# Patient Record
Sex: Male | Born: 2000 | Race: Black or African American | Hispanic: No | Marital: Single | Smoking: Never smoker
Health system: Southern US, Community
[De-identification: ages and names within clinical notes are randomized; demographics above are authoritative.]

## PROBLEM LIST (undated history)

## (undated) ENCOUNTER — Emergency Department (HOSPITAL_BASED_OUTPATIENT_CLINIC_OR_DEPARTMENT_OTHER): Payer: Medicaid Other

## (undated) DIAGNOSIS — J45909 Unspecified asthma, uncomplicated: Secondary | ICD-10-CM

## (undated) DIAGNOSIS — E119 Type 2 diabetes mellitus without complications: Secondary | ICD-10-CM

## (undated) HISTORY — PX: FRACTURE SURGERY: SHX138

## (undated) HISTORY — DX: Type 2 diabetes mellitus without complications: E11.9

---
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
## 2020-01-01 ENCOUNTER — Encounter (INDEPENDENT_AMBULATORY_CARE_PROVIDER_SITE_OTHER): Payer: Self-pay | Admitting: Family

## 2020-01-01 ENCOUNTER — Ambulatory Visit (INDEPENDENT_AMBULATORY_CARE_PROVIDER_SITE_OTHER): Payer: Medicaid Other | Admitting: Family

## 2020-01-01 ENCOUNTER — Other Ambulatory Visit (INDEPENDENT_AMBULATORY_CARE_PROVIDER_SITE_OTHER): Payer: Self-pay

## 2020-01-01 ENCOUNTER — Other Ambulatory Visit: Payer: Self-pay

## 2020-01-01 VITALS — BP 118/72 | HR 88 | Ht 70.3 in | Wt 206.6 lb

## 2020-01-01 DIAGNOSIS — E109 Type 1 diabetes mellitus without complications: Secondary | ICD-10-CM | POA: Diagnosis not present

## 2020-01-01 DIAGNOSIS — E11649 Type 2 diabetes mellitus with hypoglycemia without coma: Secondary | ICD-10-CM

## 2020-01-01 DIAGNOSIS — Z794 Long term (current) use of insulin: Secondary | ICD-10-CM

## 2020-01-01 DIAGNOSIS — R739 Hyperglycemia, unspecified: Secondary | ICD-10-CM | POA: Diagnosis not present

## 2020-01-01 DIAGNOSIS — IMO0002 Reserved for concepts with insufficient information to code with codable children: Secondary | ICD-10-CM

## 2020-01-01 DIAGNOSIS — E1065 Type 1 diabetes mellitus with hyperglycemia: Secondary | ICD-10-CM

## 2020-01-01 DIAGNOSIS — F54 Psychological and behavioral factors associated with disorders or diseases classified elsewhere: Secondary | ICD-10-CM

## 2020-01-01 LAB — POCT GLYCOSYLATED HEMOGLOBIN (HGB A1C): Hemoglobin A1C: 6.8 % — AB (ref 4.0–5.6)

## 2020-01-01 LAB — POCT GLUCOSE (DEVICE FOR HOME USE): POC Glucose: 227 mg/dl — AB (ref 70–99)

## 2020-01-01 MED ORDER — GLUCAGON (RDNA) 1 MG IJ KIT: PACK | INTRAMUSCULAR | 3 refills | Status: AC

## 2020-01-01 NOTE — Patient Instructions (Signed)
-  Always have fast sugar with you in case of low blood sugar (glucose tabs, regular juice or soda, candy) -Always wear your ID that states you have diabetes -Always bring your meter to your visit -Call/Email if you want to review blood sugars   - Decrease to 18 units of lantus  - A1c is 6.8%!!

## 2020-01-01 NOTE — Progress Notes (Signed)
Pediatric Endocrinology Diabetes Consultation Follow-up Visit  Jacob Martin 2001-07-06 [id number]  Chief Complaint: Follow-up type 1 diabetes   Starkes-Perry, [name], FNP   HPI: Jacob Martin  is a 19 y.o. male presenting for follow-up of type 1 diabetes. he is accompanied to this visit by his father.  1. 1. Jacob Martin was diagnosed with type 1 diabetes on 01/08/14. He had a 2 week history of polyuria/polydipsia with new onset enuresis. The night prior to diagnosis he was staying with his grandmother who is a type 2 diabetic. She became concerned and checked a sugar which was 456 mg/dL. She then took him to the [facility] where his sugar was 409. He was not in DKA. He was admitted to the peds ward for evaluation and management and was started on MDI with Lantus and Novolog.   2. Since last visit to [facility] on 08/2019, he has been well.  No ER visits or hospitalizations.   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 He is also working for [employer]. He reports that he feels like things are going pretty well with diabetes care. He is using Dexcom CGM, it is helpful. Reports pattern of hyperglycemia in the morning but recently increased his Basaglar to 20 units which has helped. He reports occasional hypoglycemia and usually feels tired and hungry when low. He is not interested in insulin pump therapy at this time.    Insulin regimen: 20 units of Basaglar, Novolog 120/30/6 Hypoglycemia Able to feel low blood sugars.  No glucagon needed recently.  Blood glucose download:    - Dexcom CGM download    Med-alert ID: Not currently wearing. Injection sites: Abdomen and arms  Annual labs due: Ordered.  Ophthalmology due: 2018. Advised that he is overdue and importance of eye exam.      3. ROS: Greater than 10 systems reviewed with pertinent positives listed in HPI, otherwise neg. Constitutional: Sleeping well. Weight stable.  Eyes: No changes in vision. Denies  blurry vision. Due for eye exam and discussed today.  Ears/Nose/Mouth/Throat: No difficulty swallowing. Denies neck pain  Cardiovascular: No palpitations. Denies tachycardia and chest pain  Respiratory: No increased work of breathing. Denies SOB  Gastrointestinal: No constipation or diarrhea. No abdominal pain Neurologic: Normal sensation, no tremor Endocrine: No polydipsia.  No hyperpigmentation Psychiatric: Normal affect. Denies anxiety and depression.   Past Medical History:   Past Medical History:  Diagnosis Date  . Asthma   . Diabetes mellitus without complication ([place])     Medications:  Outpatient Encounter Medications as of 01/01/2020  Medication Sig  . BD PEN NEEDLE NANO U/F 32G X 4 MM MISC FOR INJECTIONS 6 TIMES DAILY  . clotrimazole (MYCELEX) 10 MG troche DISSOLVE 1 LOZENGES FIVE TIMES DAILY AS DIRECTED BY MD  . Continuous Blood Gluc Receiver (DEXCOM G6 RECEIVER) DEVI 1 Device by Does not apply route daily as needed.  . Continuous Blood Gluc Sensor (DEXCOM G6 SENSOR) MISC 3 kits by Does not apply route daily as needed (Change sensor every 10 days).  . Continuous Blood Gluc Transmit (DEXCOM G6 TRANSMITTER) MISC 1 kit by Does not apply route daily as needed.  Marland Kitchen glucagon 1 MG injection Use for Severe Hypoglycemia . Inject 1 mg intramuscularly if unresponsive, unable to swallow, unconscious and/or has seizure  . glucose blood (ACCU-CHEK GUIDE) test strip Check BG 6x day  . ibuprofen (ADVIL,MOTRIN) 600 MG tablet Take 1 tab PO Q6H x 1-2 days then Q6h  prn  . insulin aspart (NOVOLOG FLEXPEN) 100 UNIT/ML FlexPen ADMINISTER UP TO 100 UNITS UNDER THE SKIN DAILY  . Insulin Glargine (LANTUS SOLOSTAR) 100 UNIT/ML Solostar Pen Use up to 50 units daily   No facility-administered encounter medications on file as of 01/01/2020.    Allergies: Allergies  Allergen Reactions  . Shrimp [Shellfish Allergy] Nausea And Vomiting    Can eat crab legs without any issues    Surgical History: Past  Surgical History:  Procedure Laterality Date  . FRACTURE SURGERY     right forearm hairline fx/ cast, but no pins/rods    Family History:  Family History  Problem Relation Age of Onset  . Hypertension Father   . Diabetes Paternal Grandmother   . Hypertension Paternal Grandmother   . Thyroid disease Paternal Grandmother   . Hypertension Paternal Grandfather       Social History: Lives with: Father   Physical Exam:  Vitals:   01/01/20 1506  BP: 118/72  Pulse: 88  Weight: 206 lb 9.6 oz (93.7 kg)  Height: 5' 10.3" (1.786 m)   BP 118/72   Pulse 88   Ht 5' 10.3" (1.786 m)   Wt 206 lb 9.6 oz (93.7 kg)   BMI 29.39 kg/m  Body mass index: body mass index is 29.39 kg/m. Blood pressure percentiles are not available for patients who are 18 years or older.  Ht Readings from Last 3 Encounters:  01/01/20 5' 10.3" (1.786 m) (61 %, Z= 0.29)*  08/29/19 5' 10.24" (1.784 m) (61 %, Z= 0.29)*  05/29/19 5' 10.87" (1.8 m) (70 %, Z= 0.53)*   * Growth percentiles are based on CDC (Boys, 2-20 Years) data.   Wt Readings from Last 3 Encounters:  01/01/20 206 lb 9.6 oz (93.7 kg) (95 %, Z= 1.66)*  08/29/19 209 lb 9.6 oz (95.1 kg) (96 %, Z= 1.75)*  05/29/19 203 lb (92.1 kg) (95 %, Z= 1.64)*   * Growth percentiles are based on CDC (Boys, 2-20 Years) data.    PHYSICAL EXAM:  General: Well developed, well nourished male in no acute distress.  Alert and oriented.  Head: Normocephalic, atraumatic.   Eyes:  Pupils equal and round. EOMI.  Sclera white.  No eye drainage.   Ears/Nose/Mouth/Throat: Nares patent, no nasal drainage.  Normal dentition, mucous membranes moist.  Neck: supple, no cervical lymphadenopathy, no thyromegaly Cardiovascular: regular rate, normal S1/S2, no murmurs Respiratory: No increased work of breathing.  Lungs clear to auscultation bilaterally.  No wheezes. Abdomen: soft, nontender, nondistended. Normal bowel sounds.  No appreciable masses  Extremities: warm, well  perfused, cap refill < 2 sec.   Musculoskeletal: Normal muscle mass.  Normal strength Skin: warm, dry.  No rash or lesions. Neurologic: alert and oriented, normal speech, no tremor   Labs: Last hemoglobin A1c: 7.5% on 08/2019 Results for orders placed or performed in visit on 01/01/20  POCT glycosylated hemoglobin (Hb A1C)  Result Value Ref Range   Hemoglobin A1C 6.8 (A) 4.0 - 5.6 %   HbA1c POC (<> result, manual entry)     HbA1c, POC (prediabetic range)     HbA1c, POC (controlled diabetic range)    POCT Glucose (Device for Home Use)  Result Value Ref Range   Glucose Fasting, POC     POC Glucose 227 (A) 70 - 99 mg/dl      Assessment/Plan: Clemens is a 19 y.o. male with uncontrolled type 1 diabetes on MDI. Jacob Martin continues to do an excellent job managing his diabetes. He  is having a pattern of hypoglycemia in the early morning after increasing his Basaglar dose, will decrease today. His hemoglobin A1c is 6.8% which meets the ADA goal of <7%   1-3. DM w/o complication type I, uncontrolled (HCC)/ Hyperglycemia/hypoglycemia/Insulin dose change.  - 18 units of Basaglar  - Novolog 120/30/6 plan   - Add 1 unit to breakfast.  - Reviewed meterp and CGM download. Discussed trends and patterns.  - Rotate injectionsites to prevent scar tissue.  - bolus 15 minutes prior to eating to limit blood sugar spikes.  - Reviewed carb counting and importance of accurate carb counting.  - Discussed signs and symptoms of hypoglycemia. Always have glucose available.  - POCT glucose and hemoglobin A1c  - Reviewed growth chart.  - Lipid panel, TFTS and microalbumin ordered.   4.  Maladaptive Behaviors  - Praise given for improvements !  - Discussed managing diabetes during college and changes he has experienced.  - Answered questions.   Follow-up:   3 months.   >45 spent today reviewing the medical chart, counseling the patient/family, and documenting today's visit.   When a patient is on insulin,  intensive monitoring of blood glucose levels is necessary to avoid hyperglycemia and hypoglycemia. Severe hyperglycemia/hypoglycemia can lead to hospital admissions and be life threatening.    Hermenia Bers,  FNP-C  Pediatric Specialist  [address] [place]  [place], [address]  Tele: [phone]

## 2020-01-09 ENCOUNTER — Telehealth (INDEPENDENT_AMBULATORY_CARE_PROVIDER_SITE_OTHER): Payer: Self-pay | Admitting: Family

## 2020-01-09 ENCOUNTER — Other Ambulatory Visit (INDEPENDENT_AMBULATORY_CARE_PROVIDER_SITE_OTHER): Payer: Self-pay | Admitting: Family

## 2020-01-09 NOTE — Telephone Encounter (Signed)
Excript was received from pharmacy. Sent throughy there.

## 2020-01-09 NOTE — Telephone Encounter (Signed)
Who's calling (name and relationship to patient) : Sahir Tolson dad  Best contact number: [phone]  Provider they see: Ovidio Kin   Reason for call: Dad called to request more NANO needles be ordered.  Call ID:      PRESCRIPTION REFILL ONLY  Name of prescription: nano needles  Pharmacy: [facility] [address]

## 2020-03-04 ENCOUNTER — Encounter (INDEPENDENT_AMBULATORY_CARE_PROVIDER_SITE_OTHER): Payer: Self-pay

## 2020-03-04 ENCOUNTER — Telehealth (INDEPENDENT_AMBULATORY_CARE_PROVIDER_SITE_OTHER): Payer: Self-pay | Admitting: Family

## 2020-03-04 NOTE — Telephone Encounter (Signed)
Has type 1 diabetes. Due to his diabetes he needs access to snacks/food/water/drinks at all times including when at desk. He should also be allowed to use bathroom as needed.

## 2020-03-04 NOTE — Telephone Encounter (Signed)
Called patient to let him know letter would be ready for pick ip in the morning. No vm option to leave a message

## 2020-03-04 NOTE — Telephone Encounter (Signed)
Writer reached back out to patient to let him know letter was ready for pick up, states he will be by tomorrow to pick up

## 2020-03-04 NOTE — Telephone Encounter (Signed)
Who's calling (name and relationship to patient) : Jacob Martin (self)  Best contact number: [phone]  Provider they see: Ovidio Kin  Reason for call:   Jacob Martin came into the office today requesting a modified work note stating that he needs to have access to snacks at his desk at work and the ability to get up to use the restroom because of his T1D. Please call [name] at [phone] when letter is completed and he will pick that up. Requesting if possible letter be completed by Friday  Call ID:      PRESCRIPTION REFILL ONLY  Name of prescription:  Pharmacy:

## 2020-03-04 NOTE — Telephone Encounter (Signed)
What verbiage would I use for this?

## 2020-03-04 NOTE — Telephone Encounter (Signed)
Letter is typed and ready for pick up

## 2020-04-01 ENCOUNTER — Other Ambulatory Visit (INDEPENDENT_AMBULATORY_CARE_PROVIDER_SITE_OTHER): Payer: Self-pay | Admitting: "Endocrinology

## 2020-04-01 ENCOUNTER — Telehealth (INDEPENDENT_AMBULATORY_CARE_PROVIDER_SITE_OTHER): Payer: Self-pay | Admitting: Family

## 2020-04-01 ENCOUNTER — Ambulatory Visit (INDEPENDENT_AMBULATORY_CARE_PROVIDER_SITE_OTHER): Payer: Medicaid Other | Admitting: Family

## 2020-04-01 ENCOUNTER — Other Ambulatory Visit: Payer: Self-pay

## 2020-04-01 ENCOUNTER — Encounter (INDEPENDENT_AMBULATORY_CARE_PROVIDER_SITE_OTHER): Payer: Self-pay | Admitting: Family

## 2020-04-01 VITALS — BP 118/68 | HR 88 | Wt 192.2 lb

## 2020-04-01 DIAGNOSIS — E109 Type 1 diabetes mellitus without complications: Secondary | ICD-10-CM | POA: Diagnosis not present

## 2020-04-01 DIAGNOSIS — R739 Hyperglycemia, unspecified: Secondary | ICD-10-CM

## 2020-04-01 DIAGNOSIS — F54 Psychological and behavioral factors associated with disorders or diseases classified elsewhere: Secondary | ICD-10-CM | POA: Diagnosis not present

## 2020-04-01 DIAGNOSIS — E11649 Type 2 diabetes mellitus with hypoglycemia without coma: Secondary | ICD-10-CM

## 2020-04-01 DIAGNOSIS — Z794 Long term (current) use of insulin: Secondary | ICD-10-CM

## 2020-04-01 LAB — LIPID PANEL
Cholesterol: 139 mg/dL (ref <170)
HDL: 43 mg/dL — ABNORMAL LOW (ref >45)
LDL Cholesterol (Calc): 83 mg/dL (calc) (ref <110)
Non-HDL Cholesterol (Calc): 96 mg/dL (calc) (ref <120)
Total CHOL/HDL Ratio: 3.2 (calc) (ref <5.0)
Triglycerides: 51 mg/dL (ref <90)

## 2020-04-01 LAB — POCT GLYCOSYLATED HEMOGLOBIN (HGB A1C): Hemoglobin A1C: 6.7 % — AB (ref 4.0–5.6)

## 2020-04-01 LAB — TSH: TSH: 1.45 mIU/L (ref 0.50–4.30)

## 2020-04-01 LAB — POCT GLUCOSE (DEVICE FOR HOME USE): POC Glucose: 176 mg/dl — AB (ref 70–99)

## 2020-04-01 LAB — T4, FREE: Free T4: 1.2 ng/dL (ref 0.8–1.4)

## 2020-04-01 MED ORDER — DEXCOM G6 SENSOR MISC: 3.0000 | Freq: Every day | 5 refills | Status: DC | PRN

## 2020-04-01 MED ORDER — DEXCOM G6 TRANSMITTER MISC: 1 refills | Status: DC

## 2020-04-01 NOTE — Telephone Encounter (Signed)
Who's calling (name and relationship to patient) : Jacob Martin (self)  Best contact number: [phone]  Provider they see: Gretchen Short  Reason for call: States that pharmacy has not received refill from our office on his Dexcom sensors. He is on his last one. He requests we call pharmacy to make sure he gets his prescription.   Call ID:      PRESCRIPTION REFILL ONLY  Name of prescription: Dexcom  [facility]: [facility] [address] In [place]

## 2020-04-01 NOTE — Patient Instructions (Signed)
-   At night, Use 175 as target blood sugar. Give 1 unit for every 40 point above 175.   - Add 1 unit to snack/dinner when working   - Clinical biochemist

## 2020-04-01 NOTE — Telephone Encounter (Signed)
Supplies have been sent to pharmacy

## 2020-04-01 NOTE — Progress Notes (Signed)
Pediatric Endocrinology Diabetes Consultation Follow-up Visit  Jacob Martin 12/31/2000 [id number]  Chief Complaint: Follow-up type 1 diabetes   Starkes-Perry, [name], FNP   HPI: Jacob Martin  is a 19 y.o. male presenting for follow-up of type 1 diabetes. he is accompanied to this visit by his father.  1. 1. Kalif was diagnosed with type 1 diabetes on 01/08/14. He had a 2 week history of polyuria/polydipsia with new onset enuresis. The night prior to diagnosis he was staying with his grandmother who is a type 2 diabetic. She became concerned and checked a sugar which was 456 mg/dL. She then took him to the [facility] where his sugar was 409. He was not in DKA. He was admitted to the peds ward for evaluation and management and was started on MDI with Lantus and Novolog.   2. Since last visit to [facility] on 12/2019, he has been well.  No ER visits or hospitalizations.   He is working full time at [name] and in school at [name]. He is wearing Dexcom CGM, it is working well for him, he wears it on his stomach. Blood sugars have been "ok", a little higher overnight. He denies missed injections but feels like his schedule makes things harder to manage. He has also been stressed because his father may need cardiac surgery.   He reports that he has lost weight due to eating less and also feels he has lost muscle mass from inactivity now that he is working. Denies diarrhea, abdominal pain, nausea and vomiting.   Insulin regimen: 18 units of Basaglar, Novolog 120/30/6 Hypoglycemia Able to feel low blood sugars.  No glucagon needed recently.  Blood glucose download:    - Dexcom CGM download    Med-alert ID: Not currently wearing. Injection sites: Abdomen and arms  Annual labs due: Ordered.  Ophthalmology due: 2018. Advised that he is overdue and importance of eye exam.      3. ROS: Greater than 10 systems reviewed with pertinent positives listed in HPI, otherwise neg. Constitutional: Sleeping  well. Weight stable.  Eyes: No changes in vision. Denies blurry vision. Due for eye exam and discussed today.  Ears/Nose/Mouth/Throat: No difficulty swallowing. Denies neck pain  Cardiovascular: No palpitations. Denies tachycardia and chest pain  Respiratory: No increased work of breathing. Denies SOB  Gastrointestinal: No constipation or diarrhea. No abdominal pain Neurologic: Normal sensation, no tremor Endocrine: No polydipsia.  No hyperpigmentation Psychiatric: Normal affect. Denies anxiety and depression.   Past Medical History:   Past Medical History:  Diagnosis Date  . Asthma   . Diabetes mellitus without complication ([place])     Medications:  Outpatient Encounter Medications as of 04/01/2020  Medication Sig  . Continuous Blood Gluc Receiver (DEXCOM G6 RECEIVER) DEVI 1 Device by Does not apply route daily as needed.  . Continuous Blood Gluc Transmit (DEXCOM G6 TRANSMITTER) MISC USE DAILY AS NEEDED  . glucagon 1 MG injection Use for Severe Hypoglycemia . Inject 1 mg intramuscularly if unresponsive, unable to swallow, unconscious and/or has seizure  . insulin aspart (NOVOLOG FLEXPEN) 100 UNIT/ML FlexPen ADMINISTER UP TO 100 UNITS UNDER THE SKIN DAILY  . Insulin Glargine (LANTUS SOLOSTAR) 100 UNIT/ML Solostar Pen Use up to 50 units daily  . BD PEN NEEDLE NANO 2ND GEN 32G X 4 MM MISC USE AS DIRECTED FOR INJECTIONS 6 TIMES PER DAY (Patient not taking: Reported on 04/01/2020)  . clotrimazole (MYCELEX) 10 MG troche DISSOLVE 1 LOZENGES FIVE TIMES DAILY AS DIRECTED BY MD  .  Continuous Blood Gluc Sensor (DEXCOM G6 SENSOR) MISC 3 kits by Does not apply route daily as needed (Change sensor every 10 days). (Patient not taking: Reported on 04/01/2020)  . glucose blood (ACCU-CHEK GUIDE) test strip Check BG 6x day (Patient not taking: Reported on 04/01/2020)  . ibuprofen (ADVIL,MOTRIN) 600 MG tablet Take 1 tab PO Q6H x 1-2 days then Q6h prn (Patient not taking: Reported on 04/01/2020)  . [DISCONTINUED]  Continuous Blood Gluc Transmit (DEXCOM G6 TRANSMITTER) MISC 1 kit by Does not apply route daily as needed.   No facility-administered encounter medications on file as of 04/01/2020.    Allergies: Allergies  Allergen Reactions  . Shrimp [Shellfish Allergy] Nausea And Vomiting    Can eat crab legs without any issues    Surgical History: Past Surgical History:  Procedure Laterality Date  . FRACTURE SURGERY     right forearm hairline fx/ cast, but no pins/rods    Family History:  Family History  Problem Relation Age of Onset  . Hypertension Father   . Diabetes Paternal Grandmother   . Hypertension Paternal Grandmother   . Thyroid disease Paternal Grandmother   . Hypertension Paternal Grandfather       Social History: Lives with: Father   Physical Exam:  Vitals:   04/01/20 1332  BP: 118/68  Pulse: 88  Weight: 192 lb 3.2 oz (87.2 kg)   BP 118/68   Pulse 88   Wt 192 lb 3.2 oz (87.2 kg)   BMI 27.34 kg/m  Body mass index: body mass index is 27.34 kg/m. Blood pressure percentiles are not available for patients who are 18 years or older.  Ht Readings from Last 3 Encounters:  01/01/20 5' 10.3" (1.786 m) (61 %, Z= 0.29)*  08/29/19 5' 10.24" (1.784 m) (61 %, Z= 0.29)*  05/29/19 5' 10.87" (1.8 m) (70 %, Z= 0.53)*   * Growth percentiles are based on CDC (Boys, 2-20 Years) data.   Wt Readings from Last 3 Encounters:  04/01/20 192 lb 3.2 oz (87.2 kg) (90 %, Z= 1.30)*  01/01/20 206 lb 9.6 oz (93.7 kg) (95 %, Z= 1.66)*  08/29/19 209 lb 9.6 oz (95.1 kg) (96 %, Z= 1.75)*   * Growth percentiles are based on CDC (Boys, 2-20 Years) data.    PHYSICAL EXAM:  General: Well developed, well nourished male in no acute distress.  Head: Normocephalic, atraumatic.   Eyes:  Pupils equal and round. EOMI.  Sclera white.  No eye drainage.   Ears/Nose/Mouth/Throat: Nares patent, no nasal drainage.  Normal dentition, mucous membranes moist.  Neck: supple, no cervical lymphadenopathy, no  thyromegaly Cardiovascular: regular rate, normal S1/S2, no murmurs Respiratory: No increased work of breathing.  Lungs clear to auscultation bilaterally.  No wheezes. Abdomen: soft, nontender, nondistended. Normal bowel sounds.  No appreciable masses  Extremities: warm, well perfused, cap refill < 2 sec.   Musculoskeletal: Normal muscle mass.  Normal strength Skin: warm, dry.  No rash or lesions. Neurologic: alert and oriented, normal speech, no tremor  Labs: Last hemoglobin A1c:6.8% on 12/2019  Results for orders placed or performed in visit on 04/01/20  POCT glycosylated hemoglobin (Hb A1C)  Result Value Ref Range   Hemoglobin A1C 6.7 (A) 4.0 - 5.6 %   HbA1c POC (<> result, manual entry)     HbA1c, POC (prediabetic range)     HbA1c, POC (controlled diabetic range)    POCT Glucose (Device for Home Use)  Result Value Ref Range   Glucose Fasting, POC  POC Glucose 176 (A) 70 - 99 mg/dl      Assessment/Plan: Reed is a 19 y.o. male with uncontrolled type 1 diabetes on MDI. Having a pattern of hyperglycemia between 4pm-3am when he is working. Will increase Novolog dose to help reduce hyperglycemia. Hemoglobin A1c is 6.7% which meets the ADA goal of <7%.   1-3. DM w/o complication type I, uncontrolled (HCC)/ Hyperglycemia/hypoglycemia/Insulin dose change.  - 18 units of Basaglar  - Novolog 120/30/6 plan   - +1 unit at bedtime - Reviewed meter and CGM download. Discussed trends and patterns.  - Rotate injection sites to prevent scar tissue.  - bolus 15 minutes prior to eating to limit blood sugar spikes.  - Reviewed carb counting and importance of accurate carb counting.  - Discussed signs and symptoms of hypoglycemia. Always have glucose available.  - POCT glucose and hemoglobin A1c  - Reviewed growth chart.  - Lipid panel, TFTS and Microalbumin ordered.   4.  Maladaptive Behaviors  - Discussed concerns and barriers to care  - Discussed balancing diabetes care with school,  work and activity  - Answered questions.    Follow-up:   3 months.   >45 spent today reviewing the medical chart, counseling the patient/family, and documenting today's visit.   When a patient is on insulin, intensive monitoring of blood glucose levels is necessary to avoid hyperglycemia and hypoglycemia. Severe hyperglycemia/hypoglycemia can lead to hospital admissions and be life threatening.    Hermenia Bers,  FNP-C  Pediatric Specialist  [address] [place]  [place], [address]  Tele: [phone]

## 2020-04-03 LAB — MICROALBUMIN / CREATININE URINE RATIO
Creatinine, Urine: 143 mg/dL (ref 20–320)
Microalb Creat Ratio: 8 mcg/mg creat (ref <30)
Microalb, Ur: 1.1 mg/dL

## 2020-04-08 ENCOUNTER — Encounter (INDEPENDENT_AMBULATORY_CARE_PROVIDER_SITE_OTHER): Payer: Self-pay | Admitting: *Deleted

## 2020-06-11 ENCOUNTER — Other Ambulatory Visit (INDEPENDENT_AMBULATORY_CARE_PROVIDER_SITE_OTHER): Payer: Self-pay | Admitting: Family

## 2020-06-11 ENCOUNTER — Telehealth (INDEPENDENT_AMBULATORY_CARE_PROVIDER_SITE_OTHER): Payer: Self-pay | Admitting: Family

## 2020-06-11 NOTE — Telephone Encounter (Signed)
Who's calling (name and relationship to patient) : Jacob Martin   Best contact number: [phone]  Provider they see: Gretchen Short  Reason for call: [facility] called stating that the office needed to call the pharmacy so his rx could be refilled  Call ID:      PRESCRIPTION REFILL ONLY  Name of prescription: novolog flexpen   Pharmacy: [facility] [place] [facility]

## 2020-06-11 NOTE — Telephone Encounter (Signed)
Script sent to pharmacy.

## 2020-06-11 NOTE — Telephone Encounter (Signed)
[name] called to f/u on this request

## 2020-07-02 ENCOUNTER — Encounter (INDEPENDENT_AMBULATORY_CARE_PROVIDER_SITE_OTHER): Payer: Self-pay | Admitting: Family

## 2020-07-02 ENCOUNTER — Other Ambulatory Visit: Payer: Self-pay

## 2020-07-02 ENCOUNTER — Ambulatory Visit (INDEPENDENT_AMBULATORY_CARE_PROVIDER_SITE_OTHER): Payer: Medicaid Other | Admitting: Family

## 2020-07-02 VITALS — BP 118/72 | HR 84 | Ht 70.51 in | Wt 198.4 lb

## 2020-07-02 DIAGNOSIS — R7309 Other abnormal glucose: Secondary | ICD-10-CM

## 2020-07-02 DIAGNOSIS — E11649 Type 2 diabetes mellitus with hypoglycemia without coma: Secondary | ICD-10-CM

## 2020-07-02 DIAGNOSIS — Z794 Long term (current) use of insulin: Secondary | ICD-10-CM | POA: Diagnosis not present

## 2020-07-02 DIAGNOSIS — R739 Hyperglycemia, unspecified: Secondary | ICD-10-CM

## 2020-07-02 DIAGNOSIS — E109 Type 1 diabetes mellitus without complications: Secondary | ICD-10-CM | POA: Diagnosis not present

## 2020-07-02 LAB — POCT GLYCOSYLATED HEMOGLOBIN (HGB A1C): Hemoglobin A1C: 7.6 % — AB (ref 4.0–5.6)

## 2020-07-02 LAB — POCT GLUCOSE (DEVICE FOR HOME USE): POC Glucose: 56 mg/dl — AB (ref 70–99)

## 2020-07-02 NOTE — Patient Instructions (Signed)

## 2020-07-02 NOTE — Progress Notes (Signed)
Pediatric Endocrinology Diabetes Consultation Follow-up Visit  Jacob Martin Nov 27, 2000 [id number]  Chief Complaint: Follow-up type 1 diabetes   Starkes-Perry, Juel Burrow, FNP   HPI: Jacob Martin  is a 19 y.o. male presenting for follow-up of type 1 diabetes. he is accompanied to this visit by his father.  1. 1. Jacob Martin was diagnosed with type 1 diabetes on 01/08/14. He had a 2 week history of polyuria/polydipsia with new onset enuresis. The night prior to diagnosis he was staying with his grandmother who is a type 2 diabetic. She became concerned and checked a sugar which was 456 mg/dL. She then took him to the [facility] where his sugar was 409. He was not in DKA. He was admitted to the peds ward for evaluation and management and was started on MDI with Lantus and Novolog.   2. Since last visit to [facility] on 03/2020, he has been well.  No ER visits or hospitalizations.   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 Planning to go back to school in 2 weeks. His job has made his blood sugars more difficult to manage, he has had more variability. He is wearing Dexcom CGM but is having problems with the sensor dropping signal.   Concerns:  - Getting back on schedule for school  - High blood sugars at night.   Insulin regimen: 18 units of Basaglar, Novolog 120/30/6 Hypoglycemia Able to feel low blood sugars.  No glucagon needed recently.  Blood glucose download:    - Dexcom CGM download     - He is having period of hypoglycemia around midnight and then rebound hyperglycemia.   Med-alert ID: Not currently wearing. Injection sites: Abdomen and arms  Annual labs due: 03/2021 Ophthalmology due: 2019. Advised that he is overdue and importance of eye exam.      3. ROS: Greater than 10 systems reviewed with pertinent positives listed in HPI, otherwise neg. Constitutional: Sleeping well. Weight stable.  Eyes: No changes in vision. Denies blurry vision. Due for eye exam and  discussed today.  Ears/Nose/Mouth/Throat: No difficulty swallowing. Denies neck pain  Cardiovascular: No palpitations. Denies tachycardia and chest pain  Respiratory: No increased work of breathing. Denies SOB  Gastrointestinal: No constipation or diarrhea. No abdominal pain Neurologic: Normal sensation, no tremor Endocrine: No polydipsia.  No hyperpigmentation Psychiatric: Normal affect. Denies anxiety and depression.   Past Medical History:   Past Medical History:  Diagnosis Date  . Asthma   . Diabetes mellitus without complication (HCC)     Medications:  Outpatient Encounter Medications as of 07/02/2020  Medication Sig Note  . BD PEN NEEDLE NANO 2ND GEN 32G X 4 MM MISC USE AS DIRECTED FOR INJECTIONS 6 TIMES PER DAY   . Continuous Blood Gluc Receiver (DEXCOM G6 RECEIVER) DEVI 1 Device by Does not apply route daily as needed.   . Continuous Blood Gluc Sensor (DEXCOM G6 SENSOR) MISC 3 kits by Does not apply route daily as needed (Change sensor every 10 days).   . Continuous Blood Gluc Transmit (DEXCOM G6 TRANSMITTER) MISC USE DAILY AS NEEDED   . glucagon 1 MG injection Use for Severe Hypoglycemia . Inject 1 mg intramuscularly if unresponsive, unable to swallow, unconscious and/or has seizure 07/02/2020: PRN  . ibuprofen (ADVIL,MOTRIN) 600 MG tablet Take 1 tab PO Q6H x 1-2 days then Q6h prn   . Insulin Glargine (LANTUS SOLOSTAR) 100 UNIT/ML Solostar Pen Use up to 50 units daily   . NOVOLOG FLEXPEN 100 UNIT/ML  FlexPen ADMINISTER UP TO 100 UNITS UNDER THE SKIN DAILY   . clotrimazole (MYCELEX) 10 MG troche DISSOLVE 1 LOZENGES FIVE TIMES DAILY AS DIRECTED BY MD (Patient not taking: Reported on 07/02/2020)   . glucose blood (ACCU-CHEK GUIDE) test strip Check BG 6x day (Patient not taking: Reported on 04/01/2020)    No facility-administered encounter medications on file as of 07/02/2020.    Allergies: Allergies  Allergen Reactions  . Shrimp [Shellfish Allergy] Nausea And Vomiting    Can eat  crab legs without any issues    Surgical History: Past Surgical History:  Procedure Laterality Date  . FRACTURE SURGERY     right forearm hairline fx/ cast, but no pins/rods    Family History:  Family History  Problem Relation Age of Onset  . Hypertension Father   . Diabetes Paternal Grandmother   . Hypertension Paternal Grandmother   . Thyroid disease Paternal Grandmother   . Hypertension Paternal Grandfather       Social History: Lives with: Father   Physical Exam:  Vitals:   07/02/20 1550  BP: 118/72  Pulse: 84  Weight: 198 lb 6.4 oz (90 kg)  Height: 5' 10.51" (1.791 m)   BP 118/72   Pulse 84   Ht 5' 10.51" (1.791 m)   Wt 198 lb 6.4 oz (90 kg)   BMI 28.06 kg/m  Body mass index: body mass index is 28.06 kg/m. Blood pressure percentiles are not available for patients who are 18 years or older.  Ht Readings from Last 3 Encounters:  07/02/20 5' 10.51" (1.791 m) (63 %, Z= 0.34)*  01/01/20 5' 10.3" (1.786 m) (61 %, Z= 0.29)*  08/29/19 5' 10.24" (1.784 m) (61 %, Z= 0.29)*   * Growth percentiles are based on CDC (Boys, 2-20 Years) data.   Wt Readings from Last 3 Encounters:  07/02/20 198 lb 6.4 oz (90 kg) (92 %, Z= 1.43)*  04/01/20 192 lb 3.2 oz (87.2 kg) (90 %, Z= 1.30)*  01/01/20 206 lb 9.6 oz (93.7 kg) (95 %, Z= 1.66)*   * Growth percentiles are based on CDC (Boys, 2-20 Years) data.    PHYSICAL EXAM:  General: Well developed, well nourished male in no acute distress.  Head: Normocephalic, atraumatic.   Eyes:  Pupils equal and round. EOMI.  Sclera white.  No eye drainage.   Ears/Nose/Mouth/Throat: Nares patent, no nasal drainage.  Normal dentition, mucous membranes moist.  Neck: supple, no cervical lymphadenopathy, no thyromegaly Cardiovascular: regular rate, normal S1/S2, no murmurs Respiratory: No increased work of breathing.  Lungs clear to auscultation bilaterally.  No wheezes. Abdomen: soft, nontender, nondistended. Normal bowel sounds.  No  appreciable masses  Extremities: warm, well perfused, cap refill < 2 sec.   Musculoskeletal: Normal muscle mass.  Normal strength Skin: warm, dry.  No rash or lesions. Neurologic: alert and oriented, normal speech, no tremor   Labs: Last hemoglobin A1c:6.7% on 03/2020  Results for orders placed or performed in visit on 07/02/20  POCT glycosylated hemoglobin (Hb A1C)  Result Value Ref Range   Hemoglobin A1C 7.6 (A) 4.0 - 5.6 %   HbA1c POC (<> result, manual entry)     HbA1c, POC (prediabetic range)     HbA1c, POC (controlled diabetic range)    POCT Glucose (Device for Home Use)  Result Value Ref Range   Glucose Fasting, POC     POC Glucose 56 (A) 70 - 99 mg/dl      Assessment/Plan: Damon is a 19 y.o. male with  uncontrolled type 1 diabetes on MDI. More variability in blood sugars which is likely due to change in schedule and routine. Having rebound hyperglycemia follow low blood sugars. Hemoglobin A1c is 7.6% which is higher then ADA goal of <7%.   1-3. DM w/o complication type I, uncontrolled (HCC)/ Hyperglycemia/hypoglycemia/Insulin dose change.  - 18 units of Basaglar  - Novolog 120/30/6 plan  - Stop adding one unit at bedtime.  - Reviewed meter and CGM download. Discussed trends and patterns.  - Rotate injection sites to prevent scar tissue.  - bolus 15 minutes prior to eating to limit blood sugar spikes.  - Reviewed carb counting and importance of accurate carb counting.  - Discussed signs and symptoms of hypoglycemia. Always have glucose available.  - POCT glucose and hemoglobin A1c  - Reviewed growth chart.  - Advised that for hypoglycemia it is important to not over treat which will cause rebound hyperglycemia.  - Advised he is overdue for eye exam.   4.  Maladaptive Behaviors  - Discussed concerns and barriers to care  - Answered questions.   Follow-up:   3 months.   >45 spent today reviewing the medical chart, counseling the patient/family, and documenting  today's visit.   When a patient is on insulin, intensive monitoring of blood glucose levels is necessary to avoid hyperglycemia and hypoglycemia. Severe hyperglycemia/hypoglycemia can lead to hospital admissions and be life threatening.    Gretchen Short,  FNP-C  Pediatric Specialist  [address]  [place], [address]  Tele: [phone]

## 2020-07-19 ENCOUNTER — Other Ambulatory Visit: Payer: Self-pay

## 2020-07-19 ENCOUNTER — Ambulatory Visit: Payer: Medicaid Other | Attending: Adult Medicine | Admitting: Physical Therapy

## 2020-07-19 ENCOUNTER — Encounter: Payer: Self-pay | Admitting: Physical Therapy

## 2020-07-19 DIAGNOSIS — M6281 Muscle weakness (generalized): Secondary | ICD-10-CM | POA: Diagnosis not present

## 2020-07-19 DIAGNOSIS — M25512 Pain in left shoulder: Secondary | ICD-10-CM | POA: Diagnosis present

## 2020-07-19 NOTE — Patient Instructions (Signed)
Access Code: [email or web address] URL: [email or web address] Date: 07/19/2020 Prepared by: Rosana Hoes  Exercises Banded Row - 4-5 x weekly - 3 sets - 10 reps Scapular Retraction with Resistance Advanced - 1 x daily - 4-5 x weekly - 3 sets - 10 reps Shoulder External Rotation Reactive Isometrics - 1 x daily - 4-5 x weekly - 3 sets - 10 reps Supine Shoulder Press - 1 x daily - 4-5 x weekly - 3 sets - 10 reps Supine Shoulder Circles with Weight - 1 x daily - 4-5 x weekly - 3 sets - 10 reps

## 2020-07-20 ENCOUNTER — Encounter: Payer: Self-pay | Admitting: Physical Therapy

## 2020-07-20 NOTE — Therapy (Signed)
serious comorbidity with body mass index (BMI) in 95th to 98th percentile for age in pediatric patient 11/04/2017  . Insulin dose changed (HCC) 11/04/2017  . Maladaptive health behaviors affecting medical condition 11/24/2016  . Adjustment reaction to medical therapy 12/24/2015  . Hypoglycemia associated with diabetes (HCC) 01/25/2014  . Parent coping with child illness or disability 01/25/2014  . Diabetes mellitus, new onset (HCC) 01/08/2014    Rosana Hoesampbell Konrad Hoak, PT, DPT, LAT, ATC 07/20/20  8:55 AM Phone: [phone] Fax: [phone]   [facility] [address] [place], [place], [address] Phone: [phone]   Fax:  [phone]  Name: Jacob Martin MRN: [id number] Date of Birth: 03/03/2001  [facility] [address] [place], [place], [address] Phone: [phone]   Fax:  [phone]  Physical Therapy Evaluation  Patient Details  Name: Jacob Martin MRN: [id number] Date of Birth: 08-19-2001 Referring Provider (PT): Eliezer Lofts, MD   Encounter Date: 07/19/2020   PT End of Session - 07/19/20 1259    Visit Number 1    Number of Visits 16    Date for PT Re-Evaluation 09/13/20    Authorization Type MCD [employer]    PT Start Time 1300    PT Stop Time 1345    PT Time Calculation (min) 45 min    Activity Tolerance Patient tolerated treatment well    Behavior During Therapy [facility] for tasks assessed/performed           Past Medical History:  Diagnosis Date  . Asthma   . Diabetes mellitus without complication [facility])     Past Surgical History:  Procedure Laterality Date  . FRACTURE SURGERY     right forearm hairline fx/ cast, but no pins/rods    There were no vitals filed for this visit.    Subjective Assessment - 07/19/20 1300    Subjective Patient reports left shoulder pain, close to a month ago he was doing snatches at the gym when his left shoulder popped out of place and then it popped back in place when he dropped the weights. No previous history of shoulder dislocations or injury. He saw a primary care doctor who thought he may have some small tears, had an x-ray but doesn't know the results. His shoulder hurts when he moves it in specific directions, he has felt it pop once since the initial injury. Most of his pain is located to the front of the shoulder. He works in [employer] and is limited at work, unable to perform any lifting tasks and has pain with carrying anything heavy. States he has trouble sleeping due to positioning of the shoulder.    Limitations House hold activities;Lifting    Diagnostic tests X-ray - unknown results    Patient Stated Goals Get shoulder better so he can work and lift weights  without pain or limitation    Currently in Pain? Yes    Pain Score 5    Worst 8.5/10   Pain Location Shoulder    Pain Orientation Left    Pain Descriptors / Indicators Throbbing;Sharp    Pain Type Acute pain    Pain Radiating Towards anerior aspect of shoulder    Pain Onset 1 to 4 weeks ago    Pain Frequency Constant    Aggravating Factors  General movement of shoulder, especially up and across chest, pulling down on shoulder while carry something heavy    Pain Relieving Factors Ice, medication with minimal benefit    Effect of Pain on Daily Activities Patient having difficulty with sleep and activities involving lifting or using left arm while at work              [facility] PT Assessment - 07/20/20 0001      Assessment   Medical Diagnosis Pain in left shoulder    Referring Provider (PT) Eliezer Lofts, MD    Onset Date/Surgical Date --   approximately 3-4 weeks ago   Hand Dominance Right    Next MD Visit Not scheduled    Prior Therapy None      Precautions   Precautions None      Restrictions   Weight Bearing Restrictions  [facility] [address] [place], [place], [address] Phone: [phone]   Fax:  [phone]  Physical Therapy Evaluation  Patient Details  Name: Jacob Martin MRN: [id number] Date of Birth: 08-19-2001 Referring Provider (PT): Eliezer Lofts, MD   Encounter Date: 07/19/2020   PT End of Session - 07/19/20 1259    Visit Number 1    Number of Visits 16    Date for PT Re-Evaluation 09/13/20    Authorization Type MCD UHC    PT Start Time 1300    PT Stop Time 1345    PT Time Calculation (min) 45 min    Activity Tolerance Patient tolerated treatment well    Behavior During Therapy [facility] for tasks assessed/performed           Past Medical History:  Diagnosis Date  . Asthma   . Diabetes mellitus without complication [facility])     Past Surgical History:  Procedure Laterality Date  . FRACTURE SURGERY     right forearm hairline fx/ cast, but no pins/rods    There were no vitals filed for this visit.    Subjective Assessment - 07/19/20 1300    Subjective Patient reports left shoulder pain, close to a month ago he was doing snatches at the gym when his left shoulder popped out of place and then it popped back in place when he dropped the weights. No previous history of shoulder dislocations or injury. He saw a primary care doctor who thought he may have some small tears, had an x-ray but doesn't know the results. His shoulder hurts when he moves it in specific directions, he has felt it pop once since the initial injury. Most of his pain is located to the front of the shoulder. He works in [employer] and is limited at work, unable to perform any lifting tasks and has pain with carrying anything heavy. States he has trouble sleeping due to positioning of the shoulder.    Limitations House hold activities;Lifting    Diagnostic tests X-ray - unknown results    Patient Stated Goals Get shoulder better so he can work and lift weights  without pain or limitation    Currently in Pain? Yes    Pain Score 5    Worst 8.5/10   Pain Location Shoulder    Pain Orientation Left    Pain Descriptors / Indicators Throbbing;Sharp    Pain Type Acute pain    Pain Radiating Towards anerior aspect of shoulder    Pain Onset 1 to 4 weeks ago    Pain Frequency Constant    Aggravating Factors  General movement of shoulder, especially up and across chest, pulling down on shoulder while carry something heavy    Pain Relieving Factors Ice, medication with minimal benefit    Effect of Pain on Daily Activities Patient having difficulty with sleep and activities involving lifting or using left arm while at work              [facility] PT Assessment - 07/20/20 0001      Assessment   Medical Diagnosis Pain in left shoulder    Referring Provider (PT) Eliezer Lofts, MD    Onset Date/Surgical Date --   approximately 3-4 weeks ago   Hand Dominance Right    Next MD Visit Not scheduled    Prior Therapy None      Precautions   Precautions None      Restrictions   Weight Bearing Restrictions  serious comorbidity with body mass index (BMI) in 95th to 98th percentile for age in pediatric patient 11/04/2017  . Insulin dose changed (HCC) 11/04/2017  . Maladaptive health behaviors affecting medical condition 11/24/2016  . Adjustment reaction to medical therapy 12/24/2015  . Hypoglycemia associated with diabetes (HCC) 01/25/2014  . Parent coping with child illness or disability 01/25/2014  . Diabetes mellitus, new onset (HCC) 01/08/2014    Rosana Hoesampbell Konrad Hoak, PT, DPT, LAT, ATC 07/20/20  8:55 AM Phone: [phone] Fax: [phone]   [facility] [address] [place], [place], [address] Phone: [phone]   Fax:  [phone]  Name: Jacob Martin MRN: [id number] Date of Birth: 03/03/2001  serious comorbidity with body mass index (BMI) in 95th to 98th percentile for age in pediatric patient 11/04/2017  . Insulin dose changed (HCC) 11/04/2017  . Maladaptive health behaviors affecting medical condition 11/24/2016  . Adjustment reaction to medical therapy 12/24/2015  . Hypoglycemia associated with diabetes (HCC) 01/25/2014  . Parent coping with child illness or disability 01/25/2014  . Diabetes mellitus, new onset (HCC) 01/08/2014    Rosana Hoesampbell Konrad Hoak, PT, DPT, LAT, ATC 07/20/20  8:55 AM Phone: [phone] Fax: [phone]   [facility] [address] [place], [place], [address] Phone: [phone]   Fax:  [phone]  Name: Jacob Martin MRN: [id number] Date of Birth: 03/03/2001

## 2020-07-31 ENCOUNTER — Ambulatory Visit: Payer: Medicaid Other | Attending: Adult Medicine | Admitting: Physical Therapy

## 2020-07-31 ENCOUNTER — Other Ambulatory Visit: Payer: Self-pay

## 2020-07-31 ENCOUNTER — Encounter: Payer: Self-pay | Admitting: Physical Therapy

## 2020-07-31 DIAGNOSIS — M6281 Muscle weakness (generalized): Secondary | ICD-10-CM | POA: Diagnosis present

## 2020-07-31 DIAGNOSIS — M25512 Pain in left shoulder: Secondary | ICD-10-CM | POA: Insufficient documentation

## 2020-07-31 NOTE — Therapy (Addendum)
[facility], [place], [address] Phone: [phone]   Fax:  [phone]  Physical Therapy Treatment / Discharge  Patient Details  Name: Jacob Martin MRN: [id number] Date of Birth: Dec 15, 2000 Referring Provider (PT): Dulce Sellar, MD   Encounter Date: 07/31/2020   PT End of Session - 07/31/20 1452    Visit Number 2    Number of Visits 16    Date for PT Re-Evaluation 09/13/20    Authorization Type MCD UHC    PT Start Time 1447    PT Stop Time 1530    PT Time Calculation (min) 43 min    Activity Tolerance Patient tolerated treatment well    Behavior During Therapy [facility] for tasks assessed/performed           Past Medical History:  Diagnosis Date  . Asthma   . Diabetes mellitus without complication [facility])     Past Surgical History:  Procedure Laterality Date  . FRACTURE SURGERY     right forearm hairline fx/ cast, but no pins/rods    There were no vitals filed for this visit.   Subjective Assessment - 07/31/20 1451    Subjective Patient reports shoulder is feeling better, there is only one specific position that hurts and is when reaching behind his back. Exercises are going well and he has been working, still avoiding heavy lifting or overhead lifitng.    Patient Stated Goals Get shoulder better so he can work and lift weights without pain or limitation    Currently in Pain? No/denies              [facility] PT Assessment - 07/31/20 0001      AROM   Overall AROM Comments Left shoulder AROM grossly WFL, increased anterior shoulder pain when reaching behind his back                         [facility] Adult PT Treatment/Exercise - 07/31/20 0001      Exercises   Exercises Shoulder      Shoulder Exercises: Supine   Horizontal ABduction 12 reps   2 sets   Theraband Level (Shoulder Horizontal ABduction) Level 2 (Red)    Diagonals 12 reps   2 sets   Theraband Level (Shoulder  Diagonals) Level 2 (Red)    Other Supine Exercises Shoulder press with elbows in using 25# 2x10      Shoulder Exercises: Seated   External Rotation 10 reps   2 sets   Theraband Level (Shoulder External Rotation) Level 2 (Red)      Shoulder Exercises: Prone   Other Prone Exercises Prone row with 25# 2x10 - cue for slow and controlled movement, especially at end range      Shoulder Exercises: Standing   Other Standing Exercises 1/2 kneeling overhead press with elbow in using 10# 2x10    Other Standing Exercises Wall ball circles at 90 deg flexion and abduction x10 cw/ccw      Shoulder Exercises: ROM/Strengthening   UBE (Upper Arm Bike) L1 x 4 min (fwd/bwd)                  PT Education - 07/31/20 1452    Education Details HEP, shoulder instability etiology and stabilization exercises    Person(s) Educated Patient    Methods Explanation;Demonstration;Verbal cues;Handout    Comprehension Verbalized understanding;Returned demonstration;Verbal cues required;Need further instruction  PT Short Term Goals - 07/19/20 1414      PT SHORT TERM GOAL #1   Title Patient will be I with initial HEP to progress with PT    Baseline Patient provided HEP at evaluation    Time 4    Period Weeks    Status New    Target Date 08/16/20      PT SHORT TERM GOAL #2   Title Patient will exhibit AROM grossly WFL to improve reaching overhead and household tasks    Baseline Left shoulder AROM grossly limited in all directions compared to contralateral side    Time 4    Period Weeks    Status New    Target Date 08/16/20      PT SHORT TERM GOAL #3   Title Patient will report </= 2/10 shoulder pain with activity to reduce functional limitation    Baseline Patient reports 5/10 shoulder pain    Time 4    Period Weeks    Status New    Target Date 08/16/20             PT Long Term Goals - 07/19/20 1415      PT LONG TERM GOAL #1   Title Patient will be I with final HEP to  maintain progress from PT    Baseline Patient provided HEP at evaluation    Time 8    Period Weeks    Status New    Target Date 09/13/20      PT LONG TERM GOAL #2   Title Patient will exhibit left shoulder strength grossly 5/5 MMT to return to lifting without limitation    Baseline Patient exhibits gross strength deficit of left shoulder    Time 8    Period Weeks    Status New    Target Date 09/13/20      PT LONG TERM GOAL #3   Title Patient will return to overhead lifting >/= 20 lbs without pain or feeling of instability to return to work and exercise    Baseline Patient unable to lift overhead    Time 8    Period Weeks    Status New    Target Date 09/13/20      PT LONG TERM GOAL #4   Title Patient will report return to >/= 90% functional level with overall activity and working tasks    Baseline Currently patient reports he feels he is at < 50% functional level    Time 8    Period Weeks    Status New    Target Date 09/13/20                 Plan - 07/31/20 1452    Clinical Impression Statement Patient tolerated therapy well with no adverse effects. When performing a posterior glide while patient supine there was a palpable posterior shift and patient reported sharp pain, so seems like there is persistent shoulder instability. Rest of session focused on progressing shoulder strength and stability/control through pain free range. He does exhibit much better motion with only limitation and pain while reaching behind his back. He reported good tolerance with progression in exercises and tolerated updated HEP well. He did require cueing for proper scapular mechanics/control and slow/controlled movements in stable positions. He would benefit from continued skilled PT to reduce pain and normalize motion and strength so he can return to work and prior level of activity without limitation.    PT Treatment/Interventions ADLs/Self Care Home Management;Cryotherapy;Dealer  Stimulation;Iontophoresis 59m/ml Dexamethasone;Moist Heat;Neuromuscular re-education;Therapeutic exercise;Therapeutic activities;Patient/family education;Manual techniques;Dry needling;Passive range of motion;Taping;Spinal Manipulations;Joint Manipulations;Vasopneumatic Device    PT Next Visit Plan Assess HEP and progress PRN, avoid mobilization to left shoulder due to instability, shoulder rythmic stabilization and stability exercises (body blade?), RC and periscapular strengthening, progress overhead press/lift    PT Home Exercise Plan ABFLNDAR    Consulted and Agree with Plan of Care Patient           Patient will benefit from skilled therapeutic intervention in order to improve the following deficits and impairments:  Decreased range of motion, Pain, Postural dysfunction, Decreased strength  Visit Diagnosis: Acute pain of left shoulder  Muscle weakness (generalized)     Problem List Patient Active Problem List   Diagnosis Date Noted  . Hyperglycemia 03/20/2019  . Obesity due to excess calories without serious comorbidity with body mass index (BMI) in 95th to 98th percentile for age in pediatric patient 11/04/2017  . [place] ([place] 11/04/2017  . Maladaptive health behaviors affecting medical condition 11/24/2016  . Adjustment reaction to medical therapy 12/24/2015  . Hypoglycemia associated with diabetes (HDiaz 01/25/2014  . Parent coping with child illness or disability 01/25/2014  . Diabetes mellitus, new onset ([place] 01/08/2014    CHilda Blades PT, DPT, LAT, ATC 07/31/20  3:47 PM Phone: [phone]: [facility] [address] [address]Phone: [phone]  Fax:  [phone] Name: DSiddiq KaluznyMRN: [id number] of Birth: [birth year]   PHYSICAL THERAPY DISCHARGE SUMMARY  Visits from Start of Care: 2  Current functional level related to goals /  functional outcomes: See above   Remaining deficits: See above   Education / Equipment: HEP  Plan: Patient agrees to discharge.  Patient goals were not met. Patient is being discharged due to not returning since the last visit.  ?????    CHilda Blades PT, DPT, LAT, ATC 09/02/20  9:13 AM Phone: [phone]: [phone]

## 2020-07-31 NOTE — Patient Instructions (Signed)
Access Code: [email or web address] URL: [email or web address] Date: 07/31/2020 Prepared by: Rosana Hoes  Exercises Banded Row - 3 x weekly - 3 sets - 10 reps Scapular Retraction with Resistance Advanced - 3 x weekly - 3 sets - 10 reps Shoulder External Rotation Reactive Isometrics - 3 x weekly - 3 sets - 10 reps Supine Shoulder Press - 3 x weekly - 3 sets - 10 reps Supine Shoulder Horizontal Abduction with Resistance - 3 x weekly - 3 sets - 12 reps Supine PNF D2 Flexion with Resistance - 3 x weekly - 3 sets - 12 reps Shoulder External Rotation and Scapular Retraction with Resistance - 7 x weekly - 3 sets - 12 reps Prone Shoulder Row - 3 x weekly - 3 sets - 10 reps Half-Kneeling Bottoms Up ONEOK - 3 x weekly - 3 sets - 6-8 reps Standing Wall Ball Circles with Mini Swiss Ball - 3 x weekly - 2 sets - 30 seconds hold Standing Wall Ball Circles in Scaption with Mini Swiss Ball - 3 x weekly - 2 sets - 30 seconds hold

## 2020-08-05 ENCOUNTER — Telehealth: Payer: Self-pay | Admitting: Physical Therapy

## 2020-08-05 ENCOUNTER — Ambulatory Visit: Payer: Medicaid Other | Admitting: Physical Therapy

## 2020-08-05 NOTE — Telephone Encounter (Signed)
Spoke with pt regarding missed appointment today. He stated he was still at work and was unable to get away. I stated when his next scheduled appointment day/ time is and if he is not able to make to  call us and we can reschedule that for him.  Olivea Sonnen PT, DPT, LAT, ATC  08/05/20  2:47 PM

## 2020-08-07 ENCOUNTER — Ambulatory Visit: Payer: Medicaid Other | Admitting: Physical Therapy

## 2020-08-12 ENCOUNTER — Ambulatory Visit: Payer: Medicaid Other | Admitting: Physical Therapy

## 2020-08-13 ENCOUNTER — Telehealth: Payer: Self-pay | Admitting: Physical Therapy

## 2020-08-13 NOTE — Telephone Encounter (Signed)
Attempted to contact patient in regard to missed PT appointment. Patient informed of missed appointment and his next scheduled appointment, reminded of attendance policy.   Rosana Hoes, PT, DPT, LAT, ATC 08/13/20  10:07 AM Phone: [phone] Fax: [phone]

## 2020-08-14 ENCOUNTER — Ambulatory Visit: Payer: Medicaid Other | Admitting: Physical Therapy

## 2020-08-15 ENCOUNTER — Telehealth: Payer: Self-pay | Admitting: Physical Therapy

## 2020-08-15 NOTE — Telephone Encounter (Signed)
Attempted to contact patient in regard to missed PT appointment. Left VM informing of missed appointment and his next scheduled appointment, reminded of attendance policy and that if he no-shows his next appointment he would be discharged from therapy.   Rosana Hoes, PT, DPT, LAT, ATC 08/15/20  2:36 PM Phone: [phone] Fax: [phone]

## 2020-08-19 ENCOUNTER — Telehealth: Payer: Self-pay | Admitting: Physical Therapy

## 2020-08-19 ENCOUNTER — Ambulatory Visit: Payer: Medicaid Other | Admitting: Physical Therapy

## 2020-08-19 NOTE — Telephone Encounter (Signed)
Attempted to contact patient due to missed PT appointment. Patient was informed of the missed appointment, and that his remaining appointment on 08/21/20 was going to be canceled due to frequent no-show rate and attendance policy. Patient encouraged to contact the front office if he would like to schedule a future appointment.  Rosana Hoes, PT, DPT, LAT, ATC 08/19/20  3:05 PM Phone: [phone] Fax: [phone]

## 2020-08-21 ENCOUNTER — Ambulatory Visit: Payer: Medicaid Other | Admitting: Physical Therapy

## 2020-08-26 ENCOUNTER — Other Ambulatory Visit: Payer: Self-pay | Admitting: "Endocrinology

## 2020-08-26 DIAGNOSIS — E109 Type 1 diabetes mellitus without complications: Secondary | ICD-10-CM

## 2020-09-20 ENCOUNTER — Telehealth (INDEPENDENT_AMBULATORY_CARE_PROVIDER_SITE_OTHER): Payer: Self-pay | Admitting: Family

## 2020-09-20 DIAGNOSIS — E109 Type 1 diabetes mellitus without complications: Secondary | ICD-10-CM

## 2020-09-20 MED ORDER — DEXCOM G6 SENSOR MISC: 5 refills | Status: DC

## 2020-09-20 MED ORDER — DEXCOM G6 TRANSMITTER MISC: 1 refills | Status: DC

## 2020-09-20 NOTE — Telephone Encounter (Signed)
°  Who's calling (name and relationship to patient) : [name] (self)  Best contact number: [phone]  Provider they see: Gretchen Short  Reason for call:  Patient dropped off DMV paperwork to be completed. Paperwork is in [employer].   PRESCRIPTION REFILL ONLY  Name of prescription:  Pharmacy:

## 2020-09-20 NOTE — Telephone Encounter (Signed)
  Who's calling (name and relationship to patient) : [name] (self)  Best contact number: [phone]  Provider they see: Gretchen Short  Reason for call: Patient requests refill be sent to pharmacy.    PRESCRIPTION REFILL ONLY  Name of prescription: Continuous Blood Gluc Sensor (DEXCOM G6 SENSOR) MISC  Continuous Blood Gluc Transmit (DEXCOM G6 TRANSMITTER) MISC  Pharmacy:  [facility] [phone] - [place], [place] - [address]

## 2020-09-20 NOTE — Telephone Encounter (Signed)
Paperwork on [employer] to be signed.

## 2020-09-24 ENCOUNTER — Telehealth: Payer: Self-pay

## 2020-09-24 NOTE — Telephone Encounter (Signed)
Done, returned to [facility].

## 2020-09-24 NOTE — Telephone Encounter (Signed)
Called.Let patient know that [facility] paperwork is ready and will be at the front for him to pick up. Patient verbally agrees,

## 2020-09-30 ENCOUNTER — Telehealth (INDEPENDENT_AMBULATORY_CARE_PROVIDER_SITE_OTHER): Payer: Self-pay | Admitting: Family

## 2020-09-30 ENCOUNTER — Telehealth (INDEPENDENT_AMBULATORY_CARE_PROVIDER_SITE_OTHER): Payer: Self-pay

## 2020-09-30 NOTE — Telephone Encounter (Signed)
Spoke with dad. Let him know that lilly application and discount cards are up at the front for him to pick up

## 2020-09-30 NOTE — Telephone Encounter (Signed)
[  3:59 PM] Zachery Conch But in the S drive for our clinic I have created Financial Assistance document. There is a copay card or insulin program link under each diabetes medication that you can click on. For financial assistance at the very bottom there are every diabetes medication financial assistance program  [3:59 PM] Molli Barrows just got rid of income documentaiton for proof of income  [3:59 PM] Zachery Conch so patient and doctor brennan fill out that form then you fax to lilly care  [3:59 PM] Zachery Conch lilly cares*

## 2020-09-30 NOTE — Telephone Encounter (Signed)
Provided Mora Bellman, CMA, link to [employer] for financial assistance for patients without insurance in regards to diabetes medications (link provided is listed as follows: [email or web address])   Provided instructions about general process --> patient/provider fill out appropriate sections and fax to [employer]. Patient should follow up with [employer] in 1-2 weeks to ensure documentation has been received.  Thank you for involving clinical pharmacist/diabetes educator to assist in providing this patient's care.   Zachery Conch, PharmD, CPP

## 2020-09-30 NOTE — Telephone Encounter (Signed)
  Who's calling (name and relationship to patient) :Dad / Roanna Raider   Best contact [phone]  Provider they AQT:MAUQJFH Dalbert Garnet  Reason for call:pt stated that he would like a call back about some questions that he has on Diabetes Education. Please advise      PRESCRIPTION REFILL ONLY  Name of prescription:  Pharmacy:

## 2020-10-02 ENCOUNTER — Ambulatory Visit (INDEPENDENT_AMBULATORY_CARE_PROVIDER_SITE_OTHER): Payer: Medicaid Other | Admitting: Family

## 2020-10-02 ENCOUNTER — Other Ambulatory Visit: Payer: Self-pay

## 2020-10-02 ENCOUNTER — Encounter (INDEPENDENT_AMBULATORY_CARE_PROVIDER_SITE_OTHER): Payer: Self-pay | Admitting: Family

## 2020-10-02 VITALS — BP 136/78 | HR 104 | Ht 70.47 in | Wt 195.0 lb

## 2020-10-02 DIAGNOSIS — E11649 Type 2 diabetes mellitus with hypoglycemia without coma: Secondary | ICD-10-CM

## 2020-10-02 DIAGNOSIS — Z794 Long term (current) use of insulin: Secondary | ICD-10-CM | POA: Diagnosis not present

## 2020-10-02 DIAGNOSIS — E109 Type 1 diabetes mellitus without complications: Secondary | ICD-10-CM

## 2020-10-02 DIAGNOSIS — E1065 Type 1 diabetes mellitus with hyperglycemia: Secondary | ICD-10-CM

## 2020-10-02 DIAGNOSIS — R739 Hyperglycemia, unspecified: Secondary | ICD-10-CM

## 2020-10-02 LAB — POCT GLYCOSYLATED HEMOGLOBIN (HGB A1C): Hemoglobin A1C: 7.3 % — AB (ref 4.0–5.6)

## 2020-10-02 LAB — POCT GLUCOSE (DEVICE FOR HOME USE): POC Glucose: 77 mg/dl (ref 70–99)

## 2020-10-02 MED ORDER — DEXCOM G6 SENSOR MISC: 5 refills | Status: DC

## 2020-10-02 MED ORDER — DEXCOM G6 TRANSMITTER MISC: 1 refills | Status: DC

## 2020-10-02 NOTE — Progress Notes (Signed)
[facility]  [address] [place], [place] [address] Telephone [phone]     Fax [phone]         Rapid-Acting Insulin Instructions (Novolog/Humalog/Apidra) (Target blood sugar 120, Insulin Sensitivity Factor 30, Insulin to Carbohydrate Ratio 1 unit for 5g)   SECTION A (Meals): 1. At mealtimes, take rapid-acting insulin according to this "Two-Component Method".  a. Measure Fingerstick Blood Glucose (or use reading on continuous glucose monitor) 0-15 minutes prior to the meal. Use the "Correction Dose Table" below to determine the dose of rapid-acting insulin needed to bring your blood sugar down to a baseline of 120. You can also calculate this dose with the following equation: (Blood sugar - target blood sugar) divided by 30.  Correction Dose Table Blood Sugar Rapid-acting Insulin units  Blood Sugar Rapid-acting Insulin units  <120 0  361-390 9  121-150 1  391-420 10  151-180 2  421-450 11  181-210 3  451-480 12  211-240 4  481-510 13  241-270 5  511-540 14  271-300 6  541-570 15  301-330 7  571-600 16  331-360 8  >600 or Hi 17   b. Estimate the number of grams of carbohydrates you will be eating (carb count). Use the "Food Dose Table" below to determine the dose of rapid-acting insulin needed to cover the carbs in the meal. You can also calculate this dose using this formula: Total carbs divided by 5.  Food Dose Table Grams of Carbs Rapid-acting Insulin units  Grams of Carbs Rapid-acting Insulin units  1-5 1  41-45 9  6-10 2  46-50 10  11-15 3  51-55 11  16-20 4  56-60 12  21-25 5  61-65 13  26-30 6  66-70 14  31-35 7  71-75 15  36-40 8  >75: Add 1 unit for every additional 5 grams of carbs    c. Add up the Correction Dose plus the Food Dose = "Total Dose" of rapid-acting insulin to be taken. d. If you know the number of carbs you will eat, take the rapid-acting insulin 0-15 minutes prior to the meal; otherwise take the  insulin immediately after the meal.   SECTION B (Bedtime/2AM): 1. Wait at least 2.5-3 hours after taking your supper rapid-acting insulin before you do your bedtime blood sugar test. Based on your blood sugar, take a "bedtime snack" according to the table below. These carbs are "Free". You don't have to cover those carbs with rapid-acting insulin.  If you want a snack with more carbs than the "bedtime snack" table allows, subtract the free carbs from the total amount of carbs in the snack and cover this carb amount with rapid-acting insulin based on the Food Dose Table from Page 1.  Use the following column for your bedtime snack: ___________________  Bedtime Carbohydrate Snack Table   Blood Sugar Large Medium Small Very Small  < 76         60 gms         50 gms         40 gms    30 gms       76-100         50 gms         40 gms         30 gms    20 gms     101-150         40 gms  30 gms         20 gms    10 gms     151-199         30 gms         20gms                       10 gms      0    200-250         20 gms         10 gms           0      0    251-300         10 gms           0           0      0      > 300           0           0                    0      0   2. If the blood sugar at bedtime is above 200, no snack is needed (though if you do want a snack, cover the entire amount of carbs based on the Food Dose Table on page 1). You will need to take additional rapid-acting insulin based on the Bedtime Sliding Scale Dose Table below.  Bedtime Sliding Scale Dose Table Blood Sugar Rapid-acting Insulin units  <200 0  201-230 1  231-260 2  261-290 3  291-320 4  321-350 5  351-380 6  381-410 7  > 410 8   3. Then take your usual dose of long-acting insulin (Lantus, Basaglar, Evaristo Bury).  4. If we ask you to check your blood sugar in the middle of the night (2AM-3AM), you should wait at least 3 hours after your last rapid-acting insulin dose before you check the blood sugar.   You will then use the Bedtime Sliding Scale Dose Table to give additional units of rapid-acting insulin if blood sugar is above 200. This may be especially necessary in times of sickness, when the illness may cause more resistance to insulin and higher blood sugar than usual.  Molli Knock, MD, CDE Signature: _____________________________________ Dessa Phi, MD   Judene Companion, MD    Gretchen Short, NP  Date: ______________

## 2020-10-02 NOTE — Progress Notes (Signed)
Pediatric Endocrinology Diabetes Consultation Follow-up Visit  Jacob Martin February 27, 2001 [id number]  Chief Complaint: Follow-up type 1 diabetes   Starkes-Perry, Juel Burrow, FNP   HPI: Jacob Martin  is a 19 y.o. male presenting for follow-up of type 1 diabetes. he is accompanied to this visit by his father.  1. 1. Jacob Martin was diagnosed with type 1 diabetes on 01/08/14. He had a 2 week history of polyuria/polydipsia with new onset enuresis. The night prior to diagnosis he was staying with his grandmother who is a type 2 diabetic. She became concerned and checked a sugar which was 456 mg/dL. She then took him to the [facility] where his sugar was 409. He was not in DKA. He was admitted to the peds ward for evaluation and management and was started on MDI with Lantus and Novolog.   2. Since last visit to [facility] on 06/2020, he has been well.  No ER visits or hospitalizations.   He has been busy with work, he is working at [employer]. In his free time he is going to the gym about 3-4 x per week. He wants to go back to school next year   He is wearing Dexcom CGm which is working well for him. He ran out of refill recently. Feels like his blood sugars are descent. He is having more hyperglycemia after dinner. He feels like his improved diet has been helping improve his blood sugars. No other concerns today.  .   Insulin regimen: 18 units of Basaglar, Novolog 120/30/6 Hypoglycemia Able to feel low blood sugars.  No glucagon needed recently.  Blood glucose download:    - Dexcom CGM download       Med-alert ID: Not currently wearing. Injection sites: Abdomen and arms  Annual labs due: 03/2021 Ophthalmology due: 2019. Advised that he is overdue and importance of eye exam.      3. ROS: Greater than 10 systems reviewed with pertinent positives listed in HPI, otherwise neg. Constitutional: Sleeping well. Weight stable.  Eyes: No changes in vision. Denies blurry vision. Due for eye exam and discussed today.   Ears/Nose/Mouth/Throat: No difficulty swallowing. Denies neck pain  Cardiovascular: No palpitations. Denies tachycardia and chest pain  Respiratory: No increased work of breathing. Denies SOB  Gastrointestinal: No constipation or diarrhea. No abdominal pain Neurologic: Normal sensation, no tremor Endocrine: No polydipsia.  No hyperpigmentation Psychiatric: Normal affect. Denies anxiety and depression.   Past Medical History:   Past Medical History:  Diagnosis Date  . Asthma   . Diabetes mellitus without complication (HCC)     Medications:  Outpatient Encounter Medications as of 10/02/2020  Medication Sig Note  . Insulin Glargine (LANTUS SOLOSTAR) 100 UNIT/ML Solostar Pen Use up to 50 units daily   . NOVOLOG FLEXPEN 100 UNIT/ML FlexPen ADMINISTER UP TO 100 UNITS UNDER THE SKIN DAILY   . BD PEN NEEDLE NANO 2ND GEN 32G X 4 MM MISC USE AS DIRECTED FOR INJECTIONS 6 TIMES PER DAY (Patient not taking: Reported on 10/02/2020)   . clotrimazole (MYCELEX) 10 MG troche DISSOLVE 1 LOZENGES FIVE TIMES DAILY AS DIRECTED BY MD (Patient not taking: Reported on 10/02/2020)   . Continuous Blood Gluc Receiver (DEXCOM G6 RECEIVER) DEVI 1 Device by Does not apply route daily as needed. (Patient not taking: Reported on 10/02/2020)   . Continuous Blood Gluc Sensor (DEXCOM G6 SENSOR) MISC Change sensor every 10 days as directed.   . Continuous Blood Gluc Transmit (DEXCOM G6 TRANSMITTER) MISC USE DAILY AS NEEDED   .  glucagon 1 MG injection Use for Severe Hypoglycemia . Inject 1 mg intramuscularly if unresponsive, unable to swallow, unconscious and/or has seizure (Patient not taking: Reported on 10/02/2020) 07/02/2020: PRN  . glucose blood (ACCU-CHEK GUIDE) test strip Check BG 6x day (Patient not taking: Reported on 07/19/2020)   . ibuprofen (ADVIL,MOTRIN) 600 MG tablet Take 1 tab PO Q6H x 1-2 days then Q6h prn (Patient not taking: Reported on 07/19/2020)   . [DISCONTINUED] Continuous Blood Gluc Sensor (DEXCOM G6  SENSOR) MISC Change sensor every 10 days as directed. (Patient not taking: Reported on 10/02/2020)   . [DISCONTINUED] Continuous Blood Gluc Transmit (DEXCOM G6 TRANSMITTER) MISC USE DAILY AS NEEDED (Patient not taking: Reported on 10/02/2020)    No facility-administered encounter medications on file as of 10/02/2020.    Allergies: Allergies  Allergen Reactions  . Shrimp [Shellfish Allergy] Nausea And Vomiting    Can eat crab legs without any issues    Surgical History: Past Surgical History:  Procedure Laterality Date  . FRACTURE SURGERY     right forearm hairline fx/ cast, but no pins/rods    Family History:  Family History  Problem Relation Age of Onset  . Hypertension Father   . Diabetes Paternal Grandmother   . Hypertension Paternal Grandmother   . Thyroid disease Paternal Grandmother   . Hypertension Paternal Grandfather       Social History: Lives with: Father   Physical Exam:  Vitals:   10/02/20 1552  BP: 136/78  Pulse: (!) 104  Weight: 195 lb (88.5 kg)  Height: 5' 10.47" (1.79 m)   BP 136/78   Pulse (!) 104   Ht 5' 10.47" (1.79 m)   Wt 195 lb (88.5 kg)   BMI 27.61 kg/m  Body mass index: body mass index is 27.61 kg/m. Blood pressure percentiles are not available for patients who are 18 years or older.  Ht Readings from Last 3 Encounters:  10/02/20 5' 10.47" (1.79 m) (63 %, Z= 0.32)*  07/02/20 5' 10.51" (1.791 m) (63 %, Z= 0.34)*  01/01/20 5' 10.3" (1.786 m) (61 %, Z= 0.29)*   * Growth percentiles are based on CDC (Boys, 2-20 Years) data.   Wt Readings from Last 3 Encounters:  10/02/20 195 lb (88.5 kg) (91 %, Z= 1.32)*  07/02/20 198 lb 6.4 oz (90 kg) (92 %, Z= 1.43)*  04/01/20 192 lb 3.2 oz (87.2 kg) (90 %, Z= 1.30)*   * Growth percentiles are based on CDC (Boys, 2-20 Years) data.    PHYSICAL EXAM:  General: Well developed, well nourished male in no acute distress.   Head: Normocephalic, atraumatic.   Eyes:  Pupils equal and round. EOMI.   Sclera white.  No eye drainage.   Ears/Nose/Mouth/Throat: Nares patent, no nasal drainage.  Normal dentition, mucous membranes moist.  Neck: supple, no cervical lymphadenopathy, no thyromegaly Cardiovascular: regular rate, normal S1/S2, no murmurs Respiratory: No increased work of breathing.  Lungs clear to auscultation bilaterally.  No wheezes. Abdomen: soft, nontender, nondistended. Normal bowel sounds.  No appreciable masses  Extremities: warm, well perfused, cap refill < 2 sec.   Musculoskeletal: Normal muscle mass.  Normal strength Skin: warm, dry.  No rash or lesions.  Neurologic: alert and oriented, normal speech, no tremor   Labs: Last hemoglobin A1c:7.6% on 06/2020  Results for orders placed or performed in visit on 10/02/20  POCT glycosylated hemoglobin (Hb A1C)  Result Value Ref Range   Hemoglobin A1C 7.3 (A) 4.0 - 5.6 %   HbA1c POC (<>  result, manual entry)     HbA1c, POC (prediabetic range)     HbA1c, POC (controlled diabetic range)    POCT Glucose (Device for Home Use)  Result Value Ref Range   Glucose Fasting, POC     POC Glucose 77 70 - 99 mg/dl      Assessment/Plan: Jacob Martin is a 19 y.o. male with uncontrolled type 1 diabetes on MDI. Having pattern of hyperglycemia post prandially at dinner. His hemoglobin A1c has improve to 7.3% today.  1-3. DM w/o complication type I, uncontrolled (HCC)/ Hyperglycemia/hypoglycemia/Insulin dose change.  - 18 units of Basaglar  - Novolog 120/30/6 plan at breakfast and lunch   - Start 120/30/5 plan at dinner.  - Reviewed meter and CGM download. Discussed trends and patterns.  - Rotate injection sites to prevent scar tissue.  - bolus 15 minutes prior to eating to limit blood sugar spikes.  - Reviewed carb counting and importance of accurate carb counting.  - Discussed signs and symptoms of hypoglycemia. Always have glucose available.  - POCT glucose and hemoglobin A1c  - Reviewed growth chart.   4.  Maladaptive Behaviors  -  Discussed concerns and barriers to care  - Answered questions.   Follow-up:   3 months.    >30  spent today reviewing the medical chart, counseling the patient/family, and documenting today's visit.  When a patient is on insulin, intensive monitoring of blood glucose levels is necessary to avoid hyperglycemia and hypoglycemia. Severe hyperglycemia/hypoglycemia can lead to hospital admissions and be life threatening.    Gretchen Short,  FNP-C  Pediatric Specialist  [address]  [place], [address]  Tele: [phone]

## 2020-10-02 NOTE — Patient Instructions (Addendum)
Hypoglycemia  . Shaking or trembling. . Sweating and chills. . Dizziness or lightheadedness. . Faster heart rate. Marland Kitchen Headaches. . Hunger. . Nausea. . Nervousness or irritability. . Pale skin. Marland Kitchen Restless sleep. . Weakness. Kennis Carina vision. . Confusion or trouble concentrating. . Sleepiness. . Slurred speech. . Tingling or numbness in the face or mouth.  How do I treat an episode of hypoglycemia? The American Diabetes Association recommends the "15-15 rule" for an episode of hypoglycemia: . Eat or drink 15 grams of carbs to raise your blood sugar. . After 15 minutes, check your blood sugar. . If it's still below 70 mg/dL, have another 15 grams of carbs. . Repeat until your blood sugar is at least 70 mg/dL.  Hyperglycemia  . Frequent urination . Increased thirst . Blurred vision . Fatigue . Headache Diabetic Ketoacidosis (DKA)  If hyperglycemia goes untreated, it can cause toxic acids (ketones) to build up in your blood and urine (ketoacidosis). Signs and symptoms include: . Fruity-smelling breath . Nausea and vomiting . Shortness of breath . Dry mouth . Weakness . Confusion . Coma . Abdominal pain        Sick day/Ketones Protocol  . Check blood glucose every 2 hours  . Check urine ketones every 2 hours (until ketones are clear)  . Drink plenty of fluids (water, Pedialyte) hourly . Give rapid acting insulin correction dose every 3 hours until ketones are clear  . Notify clinic of sickness/ketones  . If you develop signs of DKA, go to ER immediately.   Hemoglobin A1c levels     - Start 1 unit for 5 grams of carbs at dinner.  - Continue 1 unit for 6 the rest of the day.

## 2021-01-01 NOTE — Progress Notes (Signed)
Pediatric Endocrinology Diabetes Consultation Follow-up Visit  Jacob Martin September 27, 2001 [id number]  Chief Complaint: Follow-up type 1 diabetes   Starkes-Perry, Juel Burrow, FNP   HPI: Jacob Martin  is a 20 y.o. male presenting for follow-up of type 1 diabetes. he is accompanied to this visit by his father.  1. 1. Endre was diagnosed with type 1 diabetes on 01/08/14. He had a 2 week history of polyuria/polydipsia with new onset enuresis. The night prior to diagnosis he was staying with his grandmother who is a type 2 diabetic. She became concerned and checked a sugar which was 456 mg/dL. She then took him to the [facility] where his sugar was 409. He was not in DKA. He was admitted to the peds ward for evaluation and management and was started on MDI with Lantus and Novolog.   2. Since last visit to [facility] on 09/2020, he has been well.  No ER visits or hospitalizations.   He is very frustrated because two of his family member, maternal grandmother and uncle both passed away recently. He also caught COVID x 2 but did not feel sick otherwise. He is concerned about weight loss but feels it could be because he has changed his diet (mainly eating salad and grill chicken), has cut back on caloris.  He works at [employer] most days of the week. He increased his basaglar from 18 units to 22 units.   Concern:  - he is concerned that he has lost weight. He has changed his diet recently and is running 3 x per week but also thinks he has lost muscle mass since he no longer lifts weight. He would like to see RD. He denies vomiting, diarrhea, bloody stool, fatigue.  - Blood sugars are running higher "in general"   Insulin regimen: 22 units of Basaglar, Novolog 120/30/6 Hypoglycemia Able to feel low blood sugars.  No glucagon needed recently.  Blood glucose download:    - Dexcom CGM download      Med-alert ID: Not currently wearing. Injection sites: Abdomen and arms  Annual labs due: 03/2021 Ophthalmology due:  2019. Advised that he is overdue and importance of eye exam.      3. ROS: Greater than 10 systems reviewed with pertinent positives listed in HPI, otherwise neg. Constitutional: Sleeping well. 12 lbs weight loss.  Eyes: No changes in vision. Denies blurry vision. Due for eye exam and discussed today.  Ears/Nose/Mouth/Throat: No difficulty swallowing. Denies neck pain  Cardiovascular: No palpitations. Denies tachycardia and chest pain  Respiratory: No increased work of breathing. Denies SOB  Gastrointestinal: No constipation or diarrhea. No abdominal pain Neurologic: Normal sensation, no tremor Endocrine: No polydipsia.  No hyperpigmentation Psychiatric: Normal affect. Denies anxiety and depression.   Past Medical History:   Past Medical History:  Diagnosis Date  . Asthma   . Diabetes mellitus without complication (HCC)     Medications:  Outpatient Encounter Medications as of 01/02/2021  Medication Sig Note  . BD PEN NEEDLE NANO 2ND GEN 32G X 4 MM MISC USE AS DIRECTED FOR INJECTIONS 6 TIMES PER DAY   . Continuous Blood Gluc Sensor (DEXCOM G6 SENSOR) MISC Change sensor every 10 days as directed.   . Continuous Blood Gluc Transmit (DEXCOM G6 TRANSMITTER) MISC USE DAILY AS NEEDED   . Insulin Glargine (LANTUS SOLOSTAR) 100 UNIT/ML Solostar Pen Use up to 50 units daily   . NOVOLOG FLEXPEN 100 UNIT/ML FlexPen ADMINISTER UP TO 100 UNITS UNDER THE SKIN DAILY   . clotrimazole (MYCELEX) 10  MG troche DISSOLVE 1 LOZENGES FIVE TIMES DAILY AS DIRECTED BY MD (Patient not taking: No sig reported)   . Continuous Blood Gluc Receiver (DEXCOM G6 RECEIVER) DEVI 1 Device by Does not apply route daily as needed. (Patient not taking: No sig reported)   . glucagon 1 MG injection Use for Severe Hypoglycemia . Inject 1 mg intramuscularly if unresponsive, unable to swallow, unconscious and/or has seizure (Patient not taking: No sig reported) 07/02/2020: PRN  . glucose blood (ACCU-CHEK GUIDE) test strip Check BG 6x  day (Patient not taking: No sig reported)   . ibuprofen (ADVIL,MOTRIN) 600 MG tablet Take 1 tab PO Q6H x 1-2 days then Q6h prn (Patient not taking: No sig reported)    No facility-administered encounter medications on file as of 01/02/2021.    Allergies: Allergies  Allergen Reactions  . Shrimp [Shellfish Allergy] Nausea And Vomiting    Can eat crab legs without any issues    Surgical History: Past Surgical History:  Procedure Laterality Date  . FRACTURE SURGERY     right forearm hairline fx/ cast, but no pins/rods    Family History:  Family History  Problem Relation Age of Onset  . Hypertension Father   . Diabetes Paternal Grandmother   . Hypertension Paternal Grandmother   . Thyroid disease Paternal Grandmother   . Hypertension Paternal Grandfather       Social History: Lives with: Father   Physical Exam:  Vitals:   01/02/21 1522  BP: 118/76  Pulse: 100  Weight: 183 lb 12.8 oz (83.4 kg)  Height: 5' 10.47" (1.79 m)   BP 118/76 (BP Location: Right Arm, Patient Position: Sitting, Cuff Size: Normal)   Pulse 100   Ht 5' 10.47" (1.79 m)   Wt 183 lb 12.8 oz (83.4 kg)   BMI 26.02 kg/m  Body mass index: body mass index is 26.02 kg/m. Blood pressure percentiles are not available for patients who are 18 years or older.  Ht Readings from Last 3 Encounters:  01/02/21 5' 10.47" (1.79 m) (62 %, Z= 0.31)*  10/02/20 5' 10.47" (1.79 m) (63 %, Z= 0.32)*  07/02/20 5' 10.51" (1.791 m) (63 %, Z= 0.34)*   * Growth percentiles are based on CDC (Boys, 2-20 Years) data.   Wt Readings from Last 3 Encounters:  01/02/21 183 lb 12.8 oz (83.4 kg) (84 %, Z= 0.99)*  10/02/20 195 lb (88.5 kg) (91 %, Z= 1.32)*  07/02/20 198 lb 6.4 oz (90 kg) (92 %, Z= 1.43)*   * Growth percentiles are based on CDC (Boys, 2-20 Years) data.    PHYSICAL EXAM:  General: Well developed, well nourished male in no acute distress.   Head: Normocephalic, atraumatic.   Eyes:  Pupils equal and round. EOMI.   Sclera white.  No eye drainage.   Ears/Nose/Mouth/Throat: Nares patent, no nasal drainage.  Normal dentition, mucous membranes moist.  Neck: supple, no cervical lymphadenopathy, no thyromegaly Cardiovascular: regular rate, normal S1/S2, no murmurs Respiratory: No increased work of breathing.  Lungs clear to auscultation bilaterally.  No wheezes. Abdomen: soft, nontender, nondistended. Normal bowel sounds.  No appreciable masses  Extremities: warm, well perfused, cap refill < 2 sec.   Musculoskeletal: Normal muscle mass.  Normal strength Skin: warm, dry.  No rash or lesions. Neurologic: alert and oriented, normal speech, no tremor   Labs: Last hemoglobin A1c:7.3% on 11/2019  Results for orders placed or performed in visit on 01/02/21  POCT Glucose (Device for Home Use)  Result Value Ref Range  Glucose Fasting, POC     POC Glucose 220 (A) 70 - 99 mg/dl  POCT glycosylated hemoglobin (Hb A1C)  Result Value Ref Range   Hemoglobin A1C 7.9 (A) 4.0 - 5.6 %   HbA1c POC (<> result, manual entry)     HbA1c, POC (prediabetic range)     HbA1c, POC (controlled diabetic range)    POCT urinalysis dipstick  Result Value Ref Range   Color, UA     Clarity, UA     Glucose, UA Positive (A) Negative   Bilirubin, UA trace    Ketones, UA     Spec Grav, UA     Blood, UA     pH, UA     Protein, UA     Urobilinogen, UA     Nitrite, UA     Leukocytes, UA     Appearance     Odor        Assessment/Plan: Kasin is a 20 y.o. male with uncontrolled type 1 diabetes on MDI. He is having a pattern of HYPOGLYCEMIA overnight which then is leading to rebound hyperglycemia. He increased his own lantus dose which is likely cause of hypoglycemia overnight. He is struggling with grief from recent death of family members and also was sick with COVID.   1-5. DM w/o complication type I, uncontrolled (HCC)/ Hyperglycemia/hypoglycemia/Insulin dose change and weight loss .  - Decrease basaglar to 18 units  - Novolog  120/30/6 plan at breakfast and lunch   - 120/30/5 plan at dinner.  - Reviewed meter and CGM download. Discussed trends and patterns.  - Rotate pump sites to prevent scar tissue.  - bolus 15 minutes prior to eating to limit blood sugar spikes.  - Reviewed carb counting and importance of accurate carb counting.  - Discussed signs and symptoms of hypoglycemia. Always have glucose available.  - POCT glucose and hemoglobin A1c  - Reviewed growth chart.  - Refer to see Georgiann Hahn, RD and Dr. Huntley Dec from psychology    Follow-up:   3 months.    >45 spent today reviewing the medical chart, counseling the patient/family, and documenting today's visit.   When a patient is on insulin, intensive monitoring of blood glucose levels is necessary to avoid hyperglycemia and hypoglycemia. Severe hyperglycemia/hypoglycemia can lead to hospital admissions and be life threatening.    Gretchen Short,  FNP-C  Pediatric Specialist  [address]  [place], [address]  Tele: [phone]

## 2021-01-02 ENCOUNTER — Other Ambulatory Visit: Payer: Self-pay

## 2021-01-02 ENCOUNTER — Ambulatory Visit (INDEPENDENT_AMBULATORY_CARE_PROVIDER_SITE_OTHER): Payer: Medicaid Other | Admitting: Family

## 2021-01-02 ENCOUNTER — Encounter (INDEPENDENT_AMBULATORY_CARE_PROVIDER_SITE_OTHER): Payer: Self-pay | Admitting: Family

## 2021-01-02 VITALS — BP 118/76 | HR 100 | Ht 70.47 in | Wt 183.8 lb

## 2021-01-02 DIAGNOSIS — Z794 Long term (current) use of insulin: Secondary | ICD-10-CM | POA: Diagnosis not present

## 2021-01-02 DIAGNOSIS — E11649 Type 2 diabetes mellitus with hypoglycemia without coma: Secondary | ICD-10-CM | POA: Diagnosis not present

## 2021-01-02 DIAGNOSIS — E1065 Type 1 diabetes mellitus with hyperglycemia: Secondary | ICD-10-CM | POA: Diagnosis not present

## 2021-01-02 DIAGNOSIS — R634 Abnormal weight loss: Secondary | ICD-10-CM

## 2021-01-02 DIAGNOSIS — E109 Type 1 diabetes mellitus without complications: Secondary | ICD-10-CM

## 2021-01-02 DIAGNOSIS — R739 Hyperglycemia, unspecified: Secondary | ICD-10-CM

## 2021-01-02 LAB — POCT GLUCOSE (DEVICE FOR HOME USE): POC Glucose: 220 mg/dl — AB (ref 70–99)

## 2021-01-02 LAB — POCT GLYCOSYLATED HEMOGLOBIN (HGB A1C): Hemoglobin A1C: 7.9 % — AB (ref 4.0–5.6)

## 2021-01-02 LAB — POCT URINALYSIS DIPSTICK: Glucose, UA: POSITIVE — AB

## 2021-01-02 NOTE — Patient Instructions (Addendum)
Hypoglycemia  . Shaking or trembling. . Sweating and chills. . Dizziness or lightheadedness. . Faster heart rate. Marland Kitchen Headaches. . Hunger. . Nausea. . Nervousness or irritability. . Pale skin. Marland Kitchen Restless sleep. . Weakness. Kennis Carina vision. . Confusion or trouble concentrating. . Sleepiness. . Slurred speech. . Tingling or numbness in the face or mouth.  How do I treat an episode of hypoglycemia? The American Diabetes Association recommends the "15-15 rule" for an episode of hypoglycemia: . Eat or drink 15 grams of carbs to raise your blood sugar. . After 15 minutes, check your blood sugar. . If it's still below 70 mg/dL, have another 15 grams of carbs. . Repeat until your blood sugar is at least 70 mg/dL.  Hyperglycemia  . Frequent urination . Increased thirst . Blurred vision . Fatigue . Headache Diabetic Ketoacidosis (DKA)  If hyperglycemia goes untreated, it can cause toxic acids (ketones) to build up in your blood and urine (ketoacidosis). Signs and symptoms include: . Fruity-smelling breath . Nausea and vomiting . Shortness of breath . Dry mouth . Weakness . Confusion . Coma . Abdominal pain        Sick day/Ketones Protocol  . Check blood glucose every 2 hours  . Check urine ketones every 2 hours (until ketones are clear)  . Drink plenty of fluids (water, Pedialyte) hourly . Give rapid acting insulin correction dose every 3 hours until ketones are clear  . Notify clinic of sickness/ketones  . If you develop signs of DKA, go to ER immediately.   Hemoglobin A1c levels      - Decrease Lantus to 20 units

## 2021-02-10 ENCOUNTER — Ambulatory Visit (INDEPENDENT_AMBULATORY_CARE_PROVIDER_SITE_OTHER): Payer: Medicaid Other | Admitting: Dietician

## 2021-02-10 NOTE — Progress Notes (Deleted)
   Medical Nutrition Therapy - Initial Assessment Appt start time: *** Appt end time: *** Reason for referral: Type 1 Diabetes Referring provider: Gretchen Short. NP - Endo Pertinent medical hx: Type 1 Diabetes (dx age: ***)  Assessment: Food allergies: *** Pertinent Medications: see medication list - insulin Vitamins/Supplements: *** Pertinent labs:  (***) POCT Glucose: *** (***) POCT Hgb A1c: ***  (***) Anthropometrics: The child was weighed, measured, and plotted on the {GROWTH PXTGGY:69485} growth chart. Ht: *** cm (*** %)  Z-score: *** Wt: *** kg (*** %)  Z-score: *** BMI: *** (*** %)  Z-score: ***  ***% of 95th% Wt-for-lg: *** %  Z-score: *** FOC: *** cm (*** %)  Z-score: *** IBW based on BMI @ 85th%: *** kg  Estimated minimum caloric needs: *** kcal/kg/day (EER x ***) Estimated minimum protein needs: *** g/kg/day (DRI) Estimated minimum fluid needs: *** mL/kg/day (Holliday Segar)  Primary concerns today: Consult for carb counting education in setting of new onset type 1 diabetes. *** accompanied pt to appt today.  Dietary Intake Hx: Usual eating pattern includes: *** meals and *** snacks per day. Location, family meals, electronics? Methods of CHO counting used: *** Preferred foods: *** Avoided foods: *** Fast-food/eating out: *** During school: *** 24-hr recall: Breakfast: *** Snack: *** Lunch: *** Snack: *** Dinner: *** Snack: *** Beverages: *** Changes made: ***  Physical Activity: ***  GI: *** GU: ***  Estimated caloric intake: *** kcal/kg/day - meets ***% of estimated needs Estimated protein intake: *** g/kg/day - meets ***% of estimated needs Estimated fluid intake: *** mL/kg/day - meets ***% of estimated needs  Nutrition Diagnosis: (***) Food and nutrition related knowledge deficient related to difficulties counting carbohydrates as evidence by *** report.   Intervention: *** Recommendations: - ***  Handouts Given: - [facility] Diabetes  Exchange List  Teach back method used.  Monitoring/Evaluation: Goals to Monitor: - Growth trends - Lab values  Follow-up in ***.  Total time spent in counseling: *** minutes.

## 2021-02-19 ENCOUNTER — Institutional Professional Consult (permissible substitution) (INDEPENDENT_AMBULATORY_CARE_PROVIDER_SITE_OTHER): Payer: Medicaid Other | Admitting: Psychology

## 2021-03-04 ENCOUNTER — Encounter (INDEPENDENT_AMBULATORY_CARE_PROVIDER_SITE_OTHER): Payer: Self-pay | Admitting: Dietician

## 2021-03-10 ENCOUNTER — Telehealth (INDEPENDENT_AMBULATORY_CARE_PROVIDER_SITE_OTHER): Payer: Self-pay | Admitting: Family

## 2021-03-10 ENCOUNTER — Other Ambulatory Visit (INDEPENDENT_AMBULATORY_CARE_PROVIDER_SITE_OTHER): Payer: Self-pay | Admitting: Family

## 2021-03-10 MED ORDER — BD PEN NEEDLE NANO 2ND GEN 32G X 4 MM MISC: 5 refills | Status: DC

## 2021-03-10 NOTE — Telephone Encounter (Signed)
  Who's calling (name and relationship to patient) :  Best contact number:[phone]  Provider they RFX:[id number] Dalbert Garnet   Reason for call:refills for Needles      PRESCRIPTION REFILL ONLY  Name of prescription:Just needles   Pharmacy: [facility] / [facility]

## 2021-03-26 ENCOUNTER — Institutional Professional Consult (permissible substitution) (INDEPENDENT_AMBULATORY_CARE_PROVIDER_SITE_OTHER): Payer: Medicaid Other | Admitting: Psychology

## 2021-04-02 ENCOUNTER — Ambulatory Visit (INDEPENDENT_AMBULATORY_CARE_PROVIDER_SITE_OTHER): Payer: Medicaid Other | Admitting: Family

## 2021-04-25 ENCOUNTER — Other Ambulatory Visit (INDEPENDENT_AMBULATORY_CARE_PROVIDER_SITE_OTHER): Payer: Self-pay | Admitting: Family

## 2021-05-01 ENCOUNTER — Ambulatory Visit (INDEPENDENT_AMBULATORY_CARE_PROVIDER_SITE_OTHER): Payer: Medicaid Other | Admitting: Family

## 2021-05-27 ENCOUNTER — Encounter (INDEPENDENT_AMBULATORY_CARE_PROVIDER_SITE_OTHER): Payer: Self-pay | Admitting: Psychology

## 2021-11-17 ENCOUNTER — Other Ambulatory Visit (INDEPENDENT_AMBULATORY_CARE_PROVIDER_SITE_OTHER): Payer: Self-pay | Admitting: Family

## 2021-11-28 ENCOUNTER — Telehealth (INDEPENDENT_AMBULATORY_CARE_PROVIDER_SITE_OTHER): Payer: Self-pay | Admitting: Family

## 2021-11-28 NOTE — Telephone Encounter (Signed)
Patietn dropped off DMV paperwork to be filled out and scheduled a follow up with Gretchen Short NP on 1/12

## 2021-11-28 NOTE — Telephone Encounter (Signed)
Paperwork is on [employer].

## 2021-12-04 ENCOUNTER — Ambulatory Visit (INDEPENDENT_AMBULATORY_CARE_PROVIDER_SITE_OTHER): Payer: Medicaid Other | Admitting: Family

## 2021-12-04 NOTE — Progress Notes (Deleted)
Pediatric Endocrinology Diabetes Consultation Follow-up Visit  Jacob SartoriusDevon [name] Martin 11/09/2001 [id number]  Chief Complaint: Follow-up type 1 diabetes   Starkes-Perry, Juel Burrowakia S, FNP   HPI: [name]  is a 21 y.o. male presenting for follow-up of type 1 diabetes. he is accompanied to this visit by his father.  1. 1. Fraser was diagnosed with type 1 diabetes on 01/08/14. He had a 2 week history of polyuria/polydipsia with new onset enuresis. The night prior to diagnosis he was staying with his grandmother who is a type 2 diabetic. She became concerned and checked a sugar which was 456 mg/dL. [facility] then [facility] his sugar was 409. He was not in DKA. He was admitted to the peds ward for evaluation and management and was started on MDI with Lantus and Novolog.    2. Since last visit to [facility] on 12/2020, he has been well.  No ER visits or hospitalizations.   He is very frustrated because two of his family member, maternal grandmother and uncle both passed away recently. He also caught COVID x 2 but did not feel sick otherwise. He is concerned about weight loss but feels it could be because he has changed his diet (mainly eating salad and grill chicken), has cut back on caloris.  He works at [employer] most days of the week. He increased his basaglar from 18 units to 22 units.   Concern:  - he is concerned that he has lost weight. He has changed his diet recently and is running 3 x per week but also thinks he has lost muscle mass since he no longer lifts weight. He would like to see RD. He denies vomiting, diarrhea, bloody stool, fatigue.  - Blood sugars are running higher "in general"   Insulin regimen: 22 units of Basaglar, Novolog 120/30/6 Hypoglycemia Able to feel low blood sugars.  No glucagon needed recently.  Blood glucose download:    - Dexcom CGM download      Med-alert ID: Not currently wearing. Injection sites: Abdomen and arms  Annual labs due: 03/2021 Ophthalmology due:  2019. Advised that he is overdue and importance of eye exam.      3. ROS: Greater than 10 systems reviewed with pertinent positives listed in HPI, otherwise neg. Constitutional: Sleeping well. 12 lbs weight loss.  Eyes: No changes in vision. Denies blurry vision. Due for eye exam and discussed today.  Ears/Nose/Mouth/Throat: No difficulty swallowing. Denies neck pain  Cardiovascular: No palpitations. Denies tachycardia and chest pain  Respiratory: No increased work of breathing. Denies SOB  Gastrointestinal: No constipation or diarrhea. No abdominal pain Neurologic: Normal sensation, no tremor Endocrine: No polydipsia.  No hyperpigmentation Psychiatric: Normal affect. Denies anxiety and depression.   Past Medical History:   Past Medical History:  Diagnosis Date   Asthma    Diabetes mellitus without complication (HCC)     Medications:  Outpatient Encounter Medications as of 12/04/2021  Medication Sig Note   clotrimazole (MYCELEX) 10 MG troche DISSOLVE 1 LOZENGES FIVE TIMES DAILY AS DIRECTED BY MD (Patient not taking: No sig reported)    Continuous Blood Gluc Receiver (DEXCOM G6 RECEIVER) DEVI 1 Device by Does not apply route daily as needed. (Patient not taking: No sig reported)    Continuous Blood Gluc Sensor (DEXCOM G6 SENSOR) MISC Change sensor every 10 days as directed.    Continuous Blood Gluc Transmit (DEXCOM G6 TRANSMITTER) MISC USE DAILY AS NEEDED    glucagon 1 MG injection Use for Severe Hypoglycemia .  Inject 1 mg intramuscularly if unresponsive, unable to swallow, unconscious and/or has seizure (Patient not taking: No sig reported) 07/02/2020: PRN   glucose blood (ACCU-CHEK GUIDE) test strip Check BG 6x day (Patient not taking: No sig reported)    ibuprofen (ADVIL,MOTRIN) 600 MG tablet Take 1 tab PO Q6H x 1-2 days then Q6h prn (Patient not taking: No sig reported)    Insulin Aspart FlexPen (NOVOLOG) 100 UNIT/ML ADMINISTER UP TO 100 UNITS UNDER THE SKIN DAILY    Insulin Glargine  (LANTUS SOLOSTAR) 100 UNIT/ML Solostar Pen Use up to 50 units daily    Insulin Pen Needle (BD PEN NEEDLE NANO 2ND GEN) 32G X 4 MM MISC USE AS DIRECTED FOR INJECTIONS 6 TIMES PER DAY    No facility-administered encounter medications on file as of 12/04/2021.    Allergies: Allergies  Allergen Reactions   Shrimp [Shellfish Allergy] Nausea And Vomiting    Can eat crab legs without any issues    Surgical History: Past Surgical History:  Procedure Laterality Date   FRACTURE SURGERY     right forearm hairline fx/ cast, but no pins/rods    Family History:  Family History  Problem Relation Age of Onset   Hypertension Father    Diabetes Paternal Grandmother    Hypertension Paternal Grandmother    Thyroid disease Paternal Grandmother    Hypertension Paternal Grandfather       Social History: Lives with: Father   Physical Exam:  There were no vitals filed for this visit.  There were no vitals taken for this visit. Body mass index: body mass index is unknown because there is no height or weight on file. Growth percentile SmartLinks can only be used for patients less than 69 years old.  Ht Readings from Last 3 Encounters:  01/02/21 5' 10.47" (1.79 m) (62 %, Z= 0.31)*  10/02/20 5' 10.47" (1.79 m) (63 %, Z= 0.32)*  07/02/20 5' 10.51" (1.791 m) (63 %, Z= 0.34)*   * Growth percentiles are based on CDC (Boys, 2-20 Years) data.   Wt Readings from Last 3 Encounters:  01/02/21 183 lb 12.8 oz (83.4 kg) (84 %, Z= 0.99)*  10/02/20 195 lb (88.5 kg) (91 %, Z= 1.32)*  07/02/20 198 lb 6.4 oz (90 kg) (92 %, Z= 1.43)*   * Growth percentiles are based on CDC (Boys, 2-20 Years) data.    PHYSICAL EXAM: General: Well developed, well nourished male in no acute distress.  Head: Normocephalic, atraumatic.   Eyes:  Pupils equal and round. EOMI.  Sclera white.  No eye drainage.   Ears/Nose/Mouth/Throat: Nares patent, no nasal drainage.  Normal dentition, mucous membranes moist.  Neck: supple, no  cervical lymphadenopathy, no thyromegaly Cardiovascular: regular rate, normal S1/S2, no murmurs Respiratory: No increased work of breathing.  Lungs clear to auscultation bilaterally.  No wheezes. Abdomen: soft, nontender, nondistended. Normal bowel sounds.  No appreciable masses  Extremities: warm, well perfused, cap refill < 2 sec.   Musculoskeletal: Normal muscle mass.  Normal strength Skin: warm, dry.  No rash or lesions. Neurologic: alert and oriented, normal speech, no tremor    Labs: Last hemoglobin A1c:7.3% on 11/2019  Results for orders placed or performed in visit on 01/02/21  POCT Glucose (Device for Home Use)  Result Value Ref Range   Glucose Fasting, POC     POC Glucose 220 (A) 70 - 99 mg/dl  POCT glycosylated hemoglobin (Hb A1C)  Result Value Ref Range   Hemoglobin A1C 7.9 (A) 4.0 - 5.6 %  HbA1c POC (<> result, manual entry)     HbA1c, POC (prediabetic range)     HbA1c, POC (controlled diabetic range)    POCT urinalysis dipstick  Result Value Ref Range   Color, UA     Clarity, UA     Glucose, UA Positive (A) Negative   Bilirubin, UA trace    Ketones, UA     Spec Grav, UA     Blood, UA     pH, UA     Protein, UA     Urobilinogen, UA     Nitrite, UA     Leukocytes, UA     Appearance     Odor        Assessment/Plan: Bradlee is a 21 y.o. male with uncontrolled type 1 diabetes on MDI. He is having a pattern of HYPOGLYCEMIA overnight which then is leading to rebound hyperglycemia. He increased his own lantus dose which is likely cause of hypoglycemia overnight. He is struggling with grief from recent death of family members and also was sick with COVID.   1-5. DM w/o complication type I, uncontrolled (HCC)/ Hyperglycemia/hypoglycemia/Insulin dose change and weight loss .  - Decrease basaglar to 18 units  - Novolog 120/30/6 plan at breakfast and lunch   - 120/30/5 plan at dinner.  - Reviewed CGM download. Discussed trends and patterns.  - Rotate injection sites  to prevent scar tissue.  - bolus 15 minutes prior to eating to limit blood sugar spikes.  - Reviewed carb counting and importance of accurate carb counting.  - Discussed signs and symptoms of hypoglycemia. Always have glucose available.  - POCT glucose and hemoglobin A1c  - Reviewed growth chart.  - Discussed closed loop insulin pumps including OMnipod 5 and Tandem Tsli m   Follow-up:   3 months.    >45 spent today reviewing the medical chart, counseling the patient/family, and documenting today's visit.   When a patient is on insulin, intensive monitoring of blood glucose levels is necessary to avoid hyperglycemia and hypoglycemia. Severe hyperglycemia/hypoglycemia can lead to hospital admissions and be life threatening.    Gretchen Short,  FNP-C  Pediatric Specialist  [address]  [place], [address]  Tele: [phone]

## 2021-12-09 ENCOUNTER — Encounter (INDEPENDENT_AMBULATORY_CARE_PROVIDER_SITE_OTHER): Payer: Self-pay | Admitting: Family

## 2021-12-09 ENCOUNTER — Other Ambulatory Visit: Payer: Self-pay

## 2021-12-09 ENCOUNTER — Ambulatory Visit (INDEPENDENT_AMBULATORY_CARE_PROVIDER_SITE_OTHER): Payer: Medicaid Other | Admitting: Family

## 2021-12-09 VITALS — BP 122/78 | HR 91 | Wt 181.4 lb

## 2021-12-09 DIAGNOSIS — E11649 Type 2 diabetes mellitus with hypoglycemia without coma: Secondary | ICD-10-CM

## 2021-12-09 DIAGNOSIS — R739 Hyperglycemia, unspecified: Secondary | ICD-10-CM | POA: Diagnosis not present

## 2021-12-09 DIAGNOSIS — E109 Type 1 diabetes mellitus without complications: Secondary | ICD-10-CM

## 2021-12-09 LAB — POCT GLUCOSE (DEVICE FOR HOME USE): POC Glucose: 167 mg/dl — AB (ref 70–99)

## 2021-12-09 LAB — POCT GLYCOSYLATED HEMOGLOBIN (HGB A1C): Hemoglobin A1C: 7.5 % — AB (ref 4.0–5.6)

## 2021-12-09 MED ORDER — BD PEN NEEDLE NANO 2ND GEN 32G X 4 MM MISC: 5 refills | Status: DC

## 2021-12-09 MED ORDER — INSULIN ASPART FLEXPEN 100 UNIT/ML ~~LOC~~ SOPN: PEN_INJECTOR | SUBCUTANEOUS | 4 refills | Status: DC

## 2021-12-09 MED ORDER — DEXCOM G6 TRANSMITTER MISC: 1 refills | Status: DC

## 2021-12-09 MED ORDER — DEXCOM G6 SENSOR MISC: 5 refills | Status: DC

## 2021-12-09 MED ORDER — INSULIN GLARGINE 100 UNIT/ML SOLOSTAR PEN: PEN_INJECTOR | SUBCUTANEOUS | 11 refills | Status: AC

## 2021-12-09 NOTE — Progress Notes (Signed)
[facility]  [address], [address] [place], [place] [address] Telephone [phone]     Fax [phone]         Rapid-Acting Insulin Instructions (Novolog/Humalog/Apidra) (Target blood sugar 120, Insulin Sensitivity Factor 30, Insulin to Carbohydrate Ratio 1 unit for 10g)   SECTION A (Meals): 1. At mealtimes, take rapid-acting insulin according to this "Two-Component Method".  a. Measure Fingerstick Blood Glucose (or use reading on continuous glucose monitor) 0-15 minutes prior to the meal. Use the "Correction Dose Table" below to determine the dose of rapid-acting insulin needed to bring your blood sugar down to a baseline of 120. You can also calculate this dose with the following equation: (Blood sugar - target blood sugar) divided by 30.  Correction Dose Table     Blood Sugar Rapid-acting Insulin units  Blood Sugar Rapid-acting Insulin units  <120 0  361-390         9  121-150 1  391-420       10  151-180 2  421-450       11  181-210 3  451-480       12  211-240 4  481-510       13  241-270 5  511-540       14  271-300 6  541-570       15  301-330 7  571-600       16  331-360 8  >600 or Hi       17   b. Estimate the number of grams of carbohydrates you will be eating (carb count). Use the "Food Dose Table" below to determine the dose of rapid-acting insulin needed to cover the carbs in the meal. You can also calculate this dose using this formula: Total carbs divided by 10.  Food Dose Table Grams of Carbs Rapid-acting Insulin units  Grams of Carbs Rapid-acting Insulin units    0-5 0  51-60        6    6-10 1  61-70        7  11-20 2  71-80        8  21-30 3  81-90        9  31-40 4  91-100       10          41-50 5  101-110       11   c. Add up the Correction Dose plus the Food Dose = "Total Dose" of rapid-acting insulin to be taken. d. If you know the number of carbs you will eat, take the rapid-acting insulin 0-15 minutes prior to the  meal; otherwise take the insulin immediately after the meal.   SECTION B (Bedtime/2AM): 1. Wait at least 2.5-3 hours after taking your supper rapid-acting insulin before you do your bedtime blood sugar test. Based on your blood sugar, take a "bedtime snack" according to the table below. These carbs are "Free". You don't have to cover those carbs with rapid-acting insulin.  If you want a snack with more carbs than the "bedtime snack" table allows, subtract the free carbs from the total amount of carbs in the snack and cover this carb amount with rapid-acting insulin based on the Food Dose Table from Page 1.  Use the following column for your bedtime snack: ___________________  Bedtime Carbohydrate Snack Table Blood Sugar Large Medium Small Very Small  < 76         60   gms         50 gms         40 gms    30 gms       76-100         50 gms         40 gms         30 gms    20 gms     101-150         40 gms         30 gms         20 gms    10 gms     151-199         30 gms         20gms                       10 gms      0    200-250         20 gms         10 gms           0      0    251-300         10 gms           0           0      0      > 300           0           0                    0      0   2. If the blood sugar at bedtime is above 200, no snack is needed (though if you do want a snack, cover the entire amount of carbs based on the Food Dose Table on page 1). You will need to take additional rapid-acting insulin based on the Bedtime Sliding Scale Dose Table below.  Bedtime Sliding Scale Dose Table Blood Sugar Rapid-acting Insulin units  <200 0  201-230 1  231-260 2  261-290 3  291-320 4  321-350 5  351-380 6  381-410 7  > 410 8   3. Then take your usual dose of long-acting insulin (Lantus, Basaglar, Tresiba).  4. If we ask you to check your blood sugar in the middle of the night (2AM-3AM), you should wait at least 3 hours after your last rapid-acting insulin dose before you  check the blood sugar.  You will then use the Bedtime Sliding Scale Dose Table to give additional units of rapid-acting insulin if blood sugar is above 200. This may be especially necessary in times of sickness, when the illness may cause more resistance to insulin and higher blood sugar than usual.  Michael Brennan, MD, CDE Signature: _____________________________________ Jennifer Badik, MD   Ashley Jessup, MD    Cristan Scherzer, NP  Date: ______________  

## 2021-12-09 NOTE — Progress Notes (Signed)
Pediatric Endocrinology Diabetes Consultation Follow-up Visit  Jacob Martin Dec 11, 2000 [id number]  Chief Complaint: Follow-up type 1 diabetes   Starkes-Perry, [name], FNP   HPI: Jacob Martin  is a 21 y.o. male presenting for follow-up of type 1 diabetes. he is accompanied to this visit by his father.  1. 1. Jacob Martin was diagnosed with type 1 diabetes on 01/08/14. He had a 2 week history of polyuria/polydipsia with new onset enuresis. The night prior to diagnosis he was staying with his grandmother who is a type 2 diabetic. She became concerned and checked a sugar which was 456 mg/dL. She then took him to the [facility] where his sugar was 409. He was not in DKA. He was admitted to the peds ward for evaluation and management and was started on MDI with Lantus and Novolog.    2. Since last visit to [facility] on 12/2020, he has been well.  No ER visits or hospitalizations.   He reports that his insurance was cancelled a few months ago, he was paying for insulin out of pocket and his Dexcom CGM. He has insurance again through [place]. He is current working for [employer] doing [employer] and [employer].   He reports that his blood sugars have been running higher lately. He has started working out/weight lifting more frequently. He states that he frequently cuts back on recommended insulin dose because he is afraid he will go low but instead he runs high during the day. At night blood sugars are "good".  He reports giving his injections consistently but usually does it after eating. Rarely misses a Basaglar dose.     Insulin regimen: 18 units of Basaglar, Novolog 120/30/6 Hypoglycemia Able to feel low blood sugars.  No glucagon needed recently.   Dexcom CGM download  - Unable to download. Reviewed trends via his cell phone.  Med-alert ID: Not currently wearing. Injection sites: Abdomen and arms  Annual labs due: next visit.  Ophthalmology due: 2019. Advised that he is overdue and importance of eye  exam.      3. ROS: Greater than 10 systems reviewed with pertinent positives listed in HPI, otherwise neg. Constitutional: Sleeping well. Weight stable.  Eyes: No changes in vision. Denies blurry vision. Due for eye exam and discussed today.  Ears/Nose/Mouth/Throat: No difficulty swallowing. Denies neck pain  Cardiovascular: No palpitations. Denies tachycardia and chest pain  Respiratory: No increased work of breathing. Denies SOB  Gastrointestinal: No constipation or diarrhea. No abdominal pain Neurologic: Normal sensation, no tremor Endocrine: No polydipsia.  No hyperpigmentation Psychiatric: Normal affect. Denies anxiety and depression.   Past Medical History:   Past Medical History:  Diagnosis Date   Asthma    Diabetes mellitus without complication ([place])     Medications:  Outpatient Encounter Medications as of 12/09/2021  Medication Sig Note   Continuous Blood Gluc Sensor (DEXCOM G6 SENSOR) MISC Change sensor every 10 days as directed.    Continuous Blood Gluc Transmit (DEXCOM G6 TRANSMITTER) MISC USE DAILY AS NEEDED    Insulin Aspart FlexPen (NOVOLOG) 100 UNIT/ML ADMINISTER UP TO 100 UNITS UNDER THE SKIN DAILY    Insulin Glargine (LANTUS SOLOSTAR) 100 UNIT/ML Solostar Pen Use up to 50 units daily    Insulin Pen Needle (BD PEN NEEDLE NANO 2ND GEN) 32G X 4 MM MISC USE AS DIRECTED FOR INJECTIONS 6 TIMES PER DAY    clotrimazole (MYCELEX) 10 MG troche DISSOLVE 1 LOZENGES FIVE TIMES DAILY AS DIRECTED BY MD (Patient not taking: No sig reported)  Continuous Blood Gluc Receiver (DEXCOM G6 RECEIVER) DEVI 1 Device by Does not apply route daily as needed. (Patient not taking: Reported on 10/02/2020)    glucagon 1 MG injection Use for Severe Hypoglycemia . Inject 1 mg intramuscularly if unresponsive, unable to swallow, unconscious and/or has seizure (Patient not taking: Reported on 10/02/2020) 07/02/2020: PRN   glucose blood (ACCU-CHEK GUIDE) test strip Check BG 6x day (Patient not taking:  Reported on 07/19/2020)    ibuprofen (ADVIL,MOTRIN) 600 MG tablet Take 1 tab PO Q6H x 1-2 days then Q6h prn (Patient not taking: Reported on 07/19/2020)    No facility-administered encounter medications on file as of 12/09/2021.    Allergies: Allergies  Allergen Reactions   Shrimp [Shellfish Allergy] Nausea And Vomiting    Can eat crab legs without any issues    Surgical History: Past Surgical History:  Procedure Laterality Date   FRACTURE SURGERY     right forearm hairline fx/ cast, but no pins/rods    Family History:  Family History  Problem Relation Age of Onset   Hypertension Father    Diabetes Paternal Grandmother    Hypertension Paternal Grandmother    Thyroid disease Paternal Grandmother    Hypertension Paternal Grandfather       Social History: Lives with: Father   Physical Exam:  Vitals:   12/09/21 1337  BP: 122/78  Pulse: 91  Weight: 181 lb 6.4 oz (82.3 kg)    BP 122/78 (BP Location: Right Arm, Patient Position: Sitting, Cuff Size: Normal)    Pulse 91    Wt 181 lb 6.4 oz (82.3 kg)    BMI 25.68 kg/m  Body mass index: body mass index is 25.68 kg/m. Growth percentile SmartLinks can only be used for patients less than 95 years old.  Ht Readings from Last 3 Encounters:  01/02/21 5' 10.47" (1.79 m) (62 %, Z= 0.31)*  10/02/20 5' 10.47" (1.79 m) (63 %, Z= 0.32)*  07/02/20 5' 10.51" (1.791 m) (63 %, Z= 0.34)*   * Growth percentiles are based on CDC (Boys, 2-20 Years) data.   Wt Readings from Last 3 Encounters:  12/09/21 181 lb 6.4 oz (82.3 kg)  01/02/21 183 lb 12.8 oz (83.4 kg) (84 %, Z= 0.99)*  10/02/20 195 lb (88.5 kg) (91 %, Z= 1.32)*   * Growth percentiles are based on CDC (Boys, 2-20 Years) data.    PHYSICAL EXAM: General: Well developed, well nourished male in no acute distress.  Head: Normocephalic, atraumatic.   Eyes:  Pupils equal and round. EOMI.  Sclera white.  No eye drainage.   Ears/Nose/Mouth/Throat: Nares patent, no nasal drainage.   Normal dentition, mucous membranes moist.  Neck: supple, no cervical lymphadenopathy, no thyromegaly Cardiovascular: regular rate, normal S1/S2, no murmurs Respiratory: No increased work of breathing.  Lungs clear to auscultation bilaterally.  No wheezes. Abdomen: soft, nontender, nondistended. Normal bowel sounds.  No appreciable masses  Extremities: warm, well perfused, cap refill < 2 sec.   Musculoskeletal: Normal muscle mass.  Normal strength Skin: warm, dry.  No rash or lesions. Neurologic: alert and oriented, normal speech, no tremor    Labs: Last hemoglobin A1c:7.3% on 12/2020  Results for orders placed or performed in visit on 12/09/21  POCT glycosylated hemoglobin (Hb A1C)  Result Value Ref Range   Hemoglobin A1C 7.5 (A) 4.0 - 5.6 %   HbA1c POC (<> result, manual entry)     HbA1c, POC (prediabetic range)     HbA1c, POC (controlled diabetic range)  POCT Glucose (Device for Home Use)  Result Value Ref Range   Glucose Fasting, POC     POC Glucose 167 (A) 70 - 99 mg/dl      Assessment/Plan: Watts is a 21 y.o. male with uncontrolled type 1 diabetes on MDI. He is inconsistent with insulin calculations and dosages. Will reduce his carb ratio and encourage him to follow carb ratio and ISF plan instead of guessing doses. Hemoglobin A1c is 7.5% which is higher then ADA goal of <7%.   1. DM w/o complication type I, uncontrolled ([place]) 2. Hyperglycemia  3. Insulin dose change.  - Lantus 18 units  - Start novolog 120/30/10 plan   - Reviewed plan with patient and provided two copies with scales.  - Reviewed  CGM download. Discussed trends and patterns.  - Rotate injectionsites to prevent scar tissue.  - bolus 15 minutes prior to eating to limit blood sugar spikes.  - Reviewed carb counting and importance of accurate carb counting.  - Discussed signs and symptoms of hypoglycemia. Always have glucose available.  - POCT glucose and hemoglobin A1c  - Reviewed growth chart.  -  Discussed insulin pumps but he declines at this time.  - Discussed importance of accurate carb counting and using novolog plan to calculate dosing.  - Answered questions.  - Refills placed for Novolog, Basaglar and Dexcom CGM.  - Encouraged to get annual eye exam.  - labs at next visit.   Follow-up:   3 months.   >45 spent today reviewing the medical chart, counseling the patient/family, and documenting today's visit.   When a patient is on insulin, intensive monitoring of blood glucose levels is necessary to avoid hyperglycemia and hypoglycemia. Severe hyperglycemia/hypoglycemia can lead to hospital admissions and be life threatening.    Hermenia Bers,  FNP-C  Pediatric Specialist  [address] [place]  [place], [address]  Tele: [phone]

## 2021-12-09 NOTE — Patient Instructions (Addendum)
It was a pleasure seeing you in clinic today. Please do not hesitate to contact me if you have questions or concerns.   Please sign up for MyChart. This is a communication tool that allows you to send an email directly to me. This can be used for questions, prescriptions and blood sugar reports. We will also release labs to you with instructions on MyChart. Please do not use MyChart if you need immediate or emergency assistance. Ask our wonderful front office staff if you need assistance.   - 1 unit for every 30 points over 120         - For example 250- 120= 150        - 150 divided by 30 = 5 units    - For carbs 1 unit for every 10 grams.        - For example, If you eat 30 grams of carbs   - 30 divided by 10 =  3 units.

## 2022-01-20 ENCOUNTER — Ambulatory Visit (INDEPENDENT_AMBULATORY_CARE_PROVIDER_SITE_OTHER): Payer: Medicaid Other | Admitting: Family

## 2022-04-30 ENCOUNTER — Other Ambulatory Visit (INDEPENDENT_AMBULATORY_CARE_PROVIDER_SITE_OTHER): Payer: Self-pay | Admitting: Family

## 2022-05-01 ENCOUNTER — Telehealth (INDEPENDENT_AMBULATORY_CARE_PROVIDER_SITE_OTHER): Payer: Self-pay | Admitting: Family

## 2022-05-01 MED ORDER — INSULIN ASPART FLEXPEN 100 UNIT/ML ~~LOC~~ SOPN: PEN_INJECTOR | SUBCUTANEOUS | 4 refills | Status: AC

## 2022-05-01 MED ORDER — BD PEN NEEDLE NANO 2ND GEN 32G X 4 MM MISC: 5 refills | Status: DC

## 2022-05-01 NOTE — Telephone Encounter (Signed)
  Name of who is calling:James   Caller's Relationship to Patient:Father   Best contact [phone]  Provider they EGB:TDVVOHY Dalbert Garnet   Reason for call:caller stated that [facility] are out of Novalog and needles until Wednesday. Dad asked if they could send it to [facility] [address] in [place], [place]       PRESCRIPTION REFILL ONLY  Name of prescription:  Pharmacy:

## 2022-07-19 ENCOUNTER — Emergency Department (HOSPITAL_BASED_OUTPATIENT_CLINIC_OR_DEPARTMENT_OTHER)
Admission: EM | Admit: 2022-07-19 | Discharge: 2022-07-19 | Discharge status: Absent without leave | Payer: Medicaid Other | Source: Home / Self Care

## 2022-07-19 ENCOUNTER — Other Ambulatory Visit: Payer: Self-pay

## 2022-10-02 ENCOUNTER — Other Ambulatory Visit: Payer: Self-pay

## 2022-10-02 ENCOUNTER — Encounter (HOSPITAL_BASED_OUTPATIENT_CLINIC_OR_DEPARTMENT_OTHER): Payer: Self-pay | Admitting: Emergency Medicine

## 2022-10-02 ENCOUNTER — Emergency Department (HOSPITAL_BASED_OUTPATIENT_CLINIC_OR_DEPARTMENT_OTHER)
Admission: EM | Admit: 2022-10-02 | Discharge: 2022-10-03 | Disposition: A | Discharge status: Home / Self Care | Payer: 59 | Attending: Emergency Medicine | Admitting: Emergency Medicine

## 2022-10-02 DIAGNOSIS — E1165 Type 2 diabetes mellitus with hyperglycemia: Secondary | ICD-10-CM | POA: Insufficient documentation

## 2022-10-02 DIAGNOSIS — E860 Dehydration: Secondary | ICD-10-CM | POA: Diagnosis not present

## 2022-10-02 DIAGNOSIS — R197 Diarrhea, unspecified: Secondary | ICD-10-CM | POA: Diagnosis not present

## 2022-10-02 DIAGNOSIS — J45909 Unspecified asthma, uncomplicated: Secondary | ICD-10-CM | POA: Diagnosis not present

## 2022-10-02 DIAGNOSIS — Z794 Long term (current) use of insulin: Secondary | ICD-10-CM | POA: Insufficient documentation

## 2022-10-02 DIAGNOSIS — R112 Nausea with vomiting, unspecified: Secondary | ICD-10-CM

## 2022-10-02 DIAGNOSIS — R739 Hyperglycemia, unspecified: Secondary | ICD-10-CM

## 2022-10-02 LAB — CBC
HCT: 46.7 % (ref 39.0–52.0)
Hemoglobin: 15.6 g/dL (ref 13.0–17.0)
MCH: 31.1 pg (ref 26.0–34.0)
MCHC: 33.4 g/dL (ref 30.0–36.0)
MCV: 93.2 fL (ref 80.0–100.0)
Platelets: 165 10*3/uL (ref 150–400)
RBC: 5.01 MIL/uL (ref 4.22–5.81)
RDW: 11.5 % (ref 11.5–15.5)
WBC: 3.1 10*3/uL — ABNORMAL LOW (ref 4.0–10.5)
nRBC: 0 % (ref 0.0–0.2)

## 2022-10-02 LAB — URINALYSIS, ROUTINE W REFLEX MICROSCOPIC
Bilirubin Urine: NEGATIVE
Glucose, UA: >1000 mg/dL — AB
Hgb urine dipstick: NEGATIVE
Ketones, ur: 15 mg/dL — AB
Leukocytes,Ua: NEGATIVE
Nitrite: NEGATIVE
Protein, ur: NEGATIVE mg/dL
Specific Gravity, Urine: 1.037 — ABNORMAL HIGH (ref 1.005–1.030)
pH: 7.5 (ref 5.0–8.0)

## 2022-10-02 LAB — COMPREHENSIVE METABOLIC PANEL
ALT: 30 U/L (ref 0–44)
AST: 17 U/L (ref 15–41)
Albumin: 4.5 g/dL (ref 3.5–5.0)
Alkaline Phosphatase: 56 U/L (ref 38–126)
Anion gap: 7 (ref 5–15)
BUN: 12 mg/dL (ref 6–20)
CO2: 28 mmol/L (ref 22–32)
Calcium: 9.5 mg/dL (ref 8.9–10.3)
Chloride: 102 mmol/L (ref 98–111)
Creatinine, Ser: 1.05 mg/dL (ref 0.61–1.24)
GFR, Estimated: >60 mL/min (ref >60)
Glucose, Bld: 388 mg/dL — ABNORMAL HIGH (ref 70–99)
Potassium: 4.4 mmol/L (ref 3.5–5.1)
Sodium: 137 mmol/L (ref 135–145)
Total Bilirubin: 0.8 mg/dL (ref 0.3–1.2)
Total Protein: 7 g/dL (ref 6.5–8.1)

## 2022-10-02 LAB — LIPASE, BLOOD: Lipase: 19 U/L (ref 11–51)

## 2022-10-02 MED ORDER — ONDANSETRON HCL 4 MG/2ML IJ SOLN
4.0000 mg | Freq: Once | INTRAMUSCULAR | Status: AC
  Administered 2022-10-02: 4 mg via INTRAVENOUS
  Filled 2022-10-02: qty 2

## 2022-10-02 MED ORDER — SODIUM CHLORIDE 0.9 % IV BOLUS
1000.0000 mL | Freq: Once | INTRAVENOUS | Status: AC
  Administered 2022-10-02: 1000 mL via INTRAVENOUS

## 2022-10-02 NOTE — ED Triage Notes (Signed)
Gl abd pain x 3 days , OTC meds no relief . Nausea , diarrhea x 2 days .

## 2022-10-03 MED ORDER — ONDANSETRON 4 MG PO TBDP: 4.0000 mg | ORAL_TABLET | Freq: Three times a day (TID) | ORAL | 0 refills | Status: DC | PRN

## 2022-10-03 NOTE — ED Provider Notes (Signed)
[facility] EMERGENCY DEPT Provider Note   CSN: [id number] Arrival date & time: 10/02/22  1608     History  Chief Complaint  Patient presents with   Abdominal Pain    Jacob Martin is a 21 y.o. male.  HPI     This is a 22 year old male with a history of diabetes who presents with nausea, vomiting, diarrhea.  Patient reports he has had 3 to 4 days of nausea, vomiting, diarrhea.  He reports intermittent crampy abdominal pain.  No known sick contacts.  Denies fever.  States his blood sugars at home have been "okay."  He has tried over-the-counter medications with minimal relief.  Home Medications Prior to Admission medications   Medication Sig Start Date End Date Taking? Authorizing Provider  ondansetron (ZOFRAN-ODT) 4 MG disintegrating tablet Take 1 tablet (4 mg total) by mouth every 8 (eight) hours as needed for nausea or vomiting. 10/03/22  Yes Naftoli Penny, Mayer Masker, MD  clotrimazole (MYCELEX) 10 MG troche DISSOLVE 1 LOZENGES FIVE TIMES DAILY AS DIRECTED BY MD Patient not taking: No sig reported 12/25/18   [provider]  Continuous Blood Gluc Receiver (DEXCOM G6 RECEIVER) DEVI 1 Device by Does not apply route daily as needed. Patient not taking: Reported on 10/02/2020 08/17/19   David Stall, MD  Continuous Blood Gluc Sensor (DEXCOM G6 SENSOR) MISC Change sensor every 10 days as directed. 12/09/21   Gretchen Short, NP  Continuous Blood Gluc Transmit (DEXCOM G6 TRANSMITTER) MISC USE DAILY AS NEEDED 12/09/21   Gretchen Short, NP  glucagon 1 MG injection Use for Severe Hypoglycemia . Inject 1 mg intramuscularly if unresponsive, unable to swallow, unconscious and/or has seizure Patient not taking: Reported on 10/02/2020 01/01/20   Gretchen Short, NP  glucose blood (ACCU-CHEK GUIDE) test strip Check BG 6x day Patient not taking: Reported on 07/19/2020 01/25/18   Gretchen Short, NP  ibuprofen (ADVIL,MOTRIN) 600 MG tablet Take 1 tab PO Q6H x 1-2 days  then Q6h prn Patient not taking: Reported on 07/19/2020 07/29/15   Lowanda Foster, NP  Insulin Aspart FlexPen (NOVOLOG) 100 UNIT/ML ADMINISTER UP TO 100 UNITS UNDER THE SKIN DAILY 05/01/22   Gretchen Short, NP  insulin glargine (LANTUS) 100 UNIT/ML Solostar Pen Inject up to 50 units per day as advised. 12/09/21   Gretchen Short, NP  Insulin Pen Needle (BD PEN NEEDLE NANO 2ND GEN) 32G X 4 MM MISC Use with insulin 05/01/22   Gretchen Short, NP      Allergies    Shrimp [shellfish allergy]    Review of Systems   Review of Systems  Constitutional:  Negative for fever.  Cardiovascular:  Negative for chest pain.  Gastrointestinal:  Positive for abdominal pain, diarrhea and nausea.  All other systems reviewed and are negative.   Physical Exam Updated Vital Signs BP 111/68   Pulse 65   Temp 98.2 F (36.8 C) (Oral)   Resp 18   Ht 1.829 m (6')   Wt 81.6 kg   SpO2 100%   BMI 24.41 kg/m  Physical Exam Vitals and nursing note reviewed.  Constitutional:      Appearance: He is well-developed.  HENT:     Head: Normocephalic and atraumatic.     Mouth/Throat:     Comments: Mucous membranes dry Eyes:     Pupils: Pupils are equal, round, and reactive to light.  Cardiovascular:     Rate and Rhythm: Normal rate and regular rhythm.     Heart sounds: Normal heart sounds.  No murmur heard. Pulmonary:     Effort: Pulmonary effort is normal. No respiratory distress.     Breath sounds: Normal breath sounds. No wheezing.  Abdominal:     General: Bowel sounds are normal.     Palpations: Abdomen is soft.     Tenderness: There is no abdominal tenderness. There is no rebound.  Musculoskeletal:     Cervical back: Neck supple.  Lymphadenopathy:     Cervical: No cervical adenopathy.  Skin:    General: Skin is warm and dry.  Neurological:     Mental Status: He is alert and oriented to person, place, and time.  Psychiatric:        Mood and Affect: Mood normal.     ED Results / Procedures /  Treatments   Labs (all labs ordered are listed, but only abnormal results are displayed) Labs Reviewed  COMPREHENSIVE METABOLIC PANEL - Abnormal; Notable for the following components:      Result Value   Glucose, Bld 388 (*)    All other components within normal limits  CBC - Abnormal; Notable for the following components:   WBC 3.1 (*)    All other components within normal limits  URINALYSIS, ROUTINE W REFLEX MICROSCOPIC - Abnormal; Notable for the following components:   Color, Urine COLORLESS (*)    Specific Gravity, Urine 1.037 (*)    Glucose, UA >1,000 (*)    Ketones, ur 15 (*)    All other components within normal limits  LIPASE, BLOOD    EKG None  Radiology No results found.  Procedures Procedures    Medications Ordered in ED Medications  sodium chloride 0.9 % bolus 1,000 mL (0 mLs Intravenous Stopped 10/03/22 0025)  ondansetron (ZOFRAN) injection 4 mg (4 mg Intravenous Given 10/02/22 2327)    ED Course/ Medical Decision Making/ A&P                           Medical Decision Making Amount and/or Complexity of Data Reviewed Labs: ordered.  Risk Prescription drug management.   This patient presents to the ED for concern of nausea, vomiting, diarrhea, this involves an extensive number of treatment options, and is a complaint that carries with it a high risk of complications and morbidity.  I considered the following differential and admission for this acute, potentially life threatening condition.  The differential diagnosis includes gastroenteritis, gastritis, DKA, less likely SBO  MDM:    This is a 21 year old male who presents with nausea, vomiting, diarrhea.  He is nontoxic and vital signs are largely reassuring.  He does clinically appear dry and has significant ketones in the urine.  He does not have an anion gap which would argue against DKA; however, I do think this reflects dehydration.  He was given fluids and Zofran.  He has no significant tenderness  on exam and his lab work is reassuring including LFTs and lipase.  Low suspicion for pancreatitis or cholecystitis.  Suspect viral gastroenteritis.  Patient was able to tolerate fluids independently.  We discussed aching sure that he stay hydrated and monitoring his blood sugars closely.  Will discharge with Zofran.  (Labs, imaging, consults)  Labs: I Ordered, and personally interpreted labs.  The pertinent results include: CBC, CMP, lipase  Imaging Studies ordered: I ordered imaging studies including none I independently visualized and interpreted imaging. I agree with the radiologist interpretation  Additional history obtained from review.  External records from outside source obtained and  reviewed including prior evaluations  Cardiac Monitoring: The patient was maintained on a cardiac monitor.  I personally viewed and interpreted the cardiac monitored which showed an underlying rhythm of: Sinus rhythm  Reevaluation: After the interventions noted above, I reevaluated the patient and found that they have :resolved  Social Determinants of Health:  lives independently  Disposition: Discharge  Co morbidities that complicate the patient evaluation  Past Medical History:  Diagnosis Date   Asthma    Diabetes mellitus without complication ([facility])      Medicines Meds ordered this encounter  Medications   sodium chloride 0.9 % bolus 1,000 mL   ondansetron (ZOFRAN) injection 4 mg   ondansetron (ZOFRAN-ODT) 4 MG disintegrating tablet    Sig: Take 1 tablet (4 mg total) by mouth every 8 (eight) hours as needed for nausea or vomiting.    Dispense:  20 tablet    Refill:  0    I have reviewed the patients home medicines and have made adjustments as needed  Problem List / ED Course: Problem List Items Addressed This Visit       Other   Hyperglycemia   Other Visit Diagnoses     Nausea vomiting and diarrhea    -  Primary   Dehydration                       Final  Clinical Impression(s) / ED Diagnoses Final diagnoses:  Nausea vomiting and diarrhea  Dehydration  Hyperglycemia    Rx / DC Orders ED Discharge Orders          Ordered    ondansetron (ZOFRAN-ODT) 4 MG disintegrating tablet  Every 8 hours PRN        10/03/22 0039              Shon Baton, MD 10/03/22 0105

## 2022-10-03 NOTE — Discharge Instructions (Signed)
You were seen today for nausea, vomiting, diarrhea.  This may be viral in nature.  Make sure that you are staying hydrated.  Take Zofran as needed for nausea and vomiting.  Monitor your blood sugars closely.

## 2022-10-03 NOTE — ED Notes (Signed)
Provided fluids for PO challenge

## 2022-11-16 ENCOUNTER — Encounter (HOSPITAL_BASED_OUTPATIENT_CLINIC_OR_DEPARTMENT_OTHER): Payer: Self-pay

## 2022-11-16 ENCOUNTER — Other Ambulatory Visit: Payer: Self-pay

## 2022-11-16 ENCOUNTER — Emergency Department (HOSPITAL_BASED_OUTPATIENT_CLINIC_OR_DEPARTMENT_OTHER)
Admission: EM | Admit: 2022-11-16 | Discharge: 2022-11-16 | Disposition: A | Discharge status: Home / Self Care | Payer: 59 | Attending: Emergency Medicine | Admitting: Emergency Medicine

## 2022-11-16 DIAGNOSIS — Z1152 Encounter for screening for COVID-19: Secondary | ICD-10-CM | POA: Diagnosis not present

## 2022-11-16 DIAGNOSIS — Z794 Long term (current) use of insulin: Secondary | ICD-10-CM | POA: Diagnosis not present

## 2022-11-16 DIAGNOSIS — B349 Viral infection, unspecified: Secondary | ICD-10-CM | POA: Diagnosis not present

## 2022-11-16 DIAGNOSIS — R059 Cough, unspecified: Secondary | ICD-10-CM | POA: Diagnosis not present

## 2022-11-16 LAB — GROUP A STREP BY PCR: Group A Strep by PCR: NOT DETECTED

## 2022-11-16 LAB — RESP PANEL BY RT-PCR (RSV, FLU A&B, COVID)  RVPGX2
Influenza A by PCR: NEGATIVE
Influenza B by PCR: NEGATIVE
Resp Syncytial Virus by PCR: NEGATIVE
SARS Coronavirus 2 by RT PCR: NEGATIVE

## 2022-11-16 MED ORDER — ONDANSETRON 4 MG PO TBDP: ORAL_TABLET | ORAL | 0 refills | Status: DC

## 2022-11-16 MED ORDER — BENZONATATE 100 MG PO CAPS: 100.0000 mg | ORAL_CAPSULE | Freq: Three times a day (TID) | ORAL | 0 refills | Status: AC

## 2022-11-16 MED ORDER — BENZONATATE 100 MG PO CAPS: 100.0000 mg | ORAL_CAPSULE | Freq: Three times a day (TID) | ORAL | 0 refills | Status: DC

## 2022-11-16 MED ORDER — IBUPROFEN 800 MG PO TABS
800.0000 mg | ORAL_TABLET | Freq: Once | ORAL | Status: AC
  Administered 2022-11-16: 800 mg via ORAL
  Filled 2022-11-16: qty 1

## 2022-11-16 MED ORDER — ONDANSETRON 4 MG PO TBDP: ORAL_TABLET | ORAL | 0 refills | Status: AC

## 2022-11-16 NOTE — ED Provider Notes (Signed)
[facility] EMERGENCY DEPT Provider Note   CSN: [id number] Arrival date & time: 11/16/22  1309     History  Chief Complaint  Patient presents with   Sore Throat    Jacob Martin is a 21 y.o. male.  21 yo M with a chief complaints of cough congestion fevers chills myalgias.  Started this morning.  Sore throat.  No known sick contacts.  Went to the hospital yesterday with his girlfriend.   Sore Throat       Home Medications Prior to Admission medications   Medication Sig Start Date End Date Taking? Authorizing Provider  benzonatate (TESSALON) 100 MG capsule Take 1 capsule (100 mg total) by mouth every 8 (eight) hours. 11/16/22   Melene Plan, DO  clotrimazole (MYCELEX) 10 MG troche DISSOLVE 1 LOZENGES FIVE TIMES DAILY AS DIRECTED BY MD Patient not taking: No sig reported 12/25/18   [provider]  Continuous Blood Gluc Receiver (DEXCOM G6 RECEIVER) DEVI 1 Device by Does not apply route daily as needed. Patient not taking: Reported on 10/02/2020 08/17/19   David Stall, MD  Continuous Blood Gluc Sensor (DEXCOM G6 SENSOR) MISC Change sensor every 10 days as directed. 12/09/21   Gretchen Short, NP  Continuous Blood Gluc Transmit (DEXCOM G6 TRANSMITTER) MISC USE DAILY AS NEEDED 12/09/21   Gretchen Short, NP  glucagon 1 MG injection Use for Severe Hypoglycemia . Inject 1 mg intramuscularly if unresponsive, unable to swallow, unconscious and/or has seizure Patient not taking: Reported on 10/02/2020 01/01/20   Gretchen Short, NP  glucose blood (ACCU-CHEK GUIDE) test strip Check BG 6x day Patient not taking: Reported on 07/19/2020 01/25/18   Gretchen Short, NP  ibuprofen (ADVIL,MOTRIN) 600 MG tablet Take 1 tab PO Q6H x 1-2 days then Q6h prn Patient not taking: Reported on 07/19/2020 07/29/15   Lowanda Foster, NP  Insulin Aspart FlexPen (NOVOLOG) 100 UNIT/ML ADMINISTER UP TO 100 UNITS UNDER THE SKIN DAILY 05/01/22   Gretchen Short, NP  insulin glargine  (LANTUS) 100 UNIT/ML Solostar Pen Inject up to 50 units per day as advised. 12/09/21   Gretchen Short, NP  Insulin Pen Needle (BD PEN NEEDLE NANO 2ND GEN) 32G X 4 MM MISC Use with insulin 05/01/22   Gretchen Short, NP  ondansetron (ZOFRAN-ODT) 4 MG disintegrating tablet 4mg  ODT q4 hours prn nausea/vomit 11/16/22   11/18/22, DO      Allergies    Shrimp [shellfish allergy]    Review of Systems   Review of Systems  Physical Exam Updated Vital Signs BP 131/72 (BP Location: Right Arm)   Pulse 84   Temp 98.4 F (36.9 C)   Resp 14   Ht 6' (1.829 m)   Wt 81.6 kg   SpO2 100%   BMI 24.41 kg/m  Physical Exam Vitals and nursing note reviewed.  Constitutional:      Appearance: He is well-developed.  HENT:     Head: Normocephalic and atraumatic.     Comments: Swollen turbinates, posterior nasal drip. Eyes:     Pupils: Pupils are equal, round, and reactive to light.  Neck:     Vascular: No JVD.  Cardiovascular:     Rate and Rhythm: Normal rate and regular rhythm.     Heart sounds: No murmur heard.    No friction rub. No gallop.  Pulmonary:     Effort: No respiratory distress.     Breath sounds: No wheezing.  Abdominal:     General: There is no distension.  Tenderness: There is no abdominal tenderness. There is no guarding or rebound.  Musculoskeletal:        General: Normal range of motion.     Cervical back: Normal range of motion and neck supple.  Skin:    Coloration: Skin is not pale.     Findings: No rash.  Neurological:     Mental Status: He is alert and oriented to person, place, and time.  Psychiatric:        Behavior: Behavior normal.     ED Results / Procedures / Treatments   Labs (all labs ordered are listed, but only abnormal results are displayed) Labs Reviewed  RESP PANEL BY RT-PCR (RSV, FLU A&B, COVID)  [id number]  GROUP A STREP BY PCR    EKG None  Radiology No results found.  Procedures Procedures    Medications Ordered in ED Medications   ibuprofen (ADVIL) tablet 800 mg (800 mg Oral Given 11/16/22 1534)    ED Course/ Medical Decision Making/ A&P                           Medical Decision Making Risk Prescription drug management.   21 yo M with a chief complaints of what sounds like an upper respiratory illness.  Started this morning.  Well-appearing nontoxic.  Clear lung sounds.  No hypoxia no tachycardia.  I do not feel he would benefit from laboratory evaluation or imaging.  He had COVID flu and strep testing performed in triage which were all negative.  I discussed results with the patient.  Will have him follow-up with his PCP in the office.  3:58 PM:  I have discussed the diagnosis/risks/treatment options with the patient.  Evaluation and diagnostic testing in the emergency department does not suggest an emergent condition requiring admission or immediate intervention beyond what has been performed at this time.  They will follow up with PCP. We also discussed returning to the ED immediately if new or worsening sx occur. We discussed the sx which are most concerning (e.g., sudden worsening pain, fever, inability to tolerate by mouth) that necessitate immediate return. Medications administered to the patient during their visit and any new prescriptions provided to the patient are listed below.  Medications given during this visit Medications  ibuprofen (ADVIL) tablet 800 mg (800 mg Oral Given 11/16/22 1534)     The patient appears reasonably screen and/or stabilized for discharge and I doubt any other medical condition or other [facility] requiring further screening, evaluation, or treatment in the ED at this time prior to discharge.          Final Clinical Impression(s) / ED Diagnoses Final diagnoses:  Acute viral syndrome    Rx / DC Orders ED Discharge Orders          Ordered    benzonatate (TESSALON) 100 MG capsule  Every 8 hours,   Status:  Discontinued        11/16/22 1545    ondansetron (ZOFRAN-ODT) 4 MG  disintegrating tablet  Status:  Discontinued        11/16/22 1545    benzonatate (TESSALON) 100 MG capsule  Every 8 hours        11/16/22 1557    ondansetron (ZOFRAN-ODT) 4 MG disintegrating tablet        11/16/22 1557              Melene Plan, DO 11/16/22 1558

## 2022-11-16 NOTE — Discharge Instructions (Signed)
Take tylenol 2 pills 4 times a day and motrin 4 pills 3 times a day.  Drink plenty of fluids.  Return for worsening shortness of breath, headache, confusion. Follow up with your family doctor.   

## 2022-11-16 NOTE — ED Triage Notes (Signed)
Pt states sore throat since waking up this morning. Pt was here as a visitor yesterday.

## 2022-11-16 NOTE — ED Notes (Signed)
Patient given discharge instructions. Questions were answered. Patient verbalized understanding of discharge instructions and care at home.  

## 2023-02-03 DIAGNOSIS — E109 Type 1 diabetes mellitus without complications: Secondary | ICD-10-CM | POA: Diagnosis not present

## 2023-02-03 DIAGNOSIS — L305 Pityriasis alba: Secondary | ICD-10-CM | POA: Diagnosis not present

## 2023-02-10 ENCOUNTER — Telehealth (INDEPENDENT_AMBULATORY_CARE_PROVIDER_SITE_OTHER): Payer: Self-pay | Admitting: Family

## 2023-02-10 NOTE — Telephone Encounter (Signed)
  Name of who is calling: Curley Spice; self  Best contact number: [phone]  Provider they see: Gwynneth Aliment  Reason for call:  Kaspar came into the office he has questions wanting to know if he is able to do jury duty.    PRESCRIPTION REFILL ONLY  Name of prescription:  Pharmacy:

## 2023-02-10 NOTE — Telephone Encounter (Signed)
I will put in a referral. If he needs to be seen before then, please let me know as it may take him a while to get in with adult endocrinology. I will be happy to see him until he establishes care but he has not been seen in over a year.

## 2023-02-10 NOTE — Telephone Encounter (Signed)
Returned call to patient to relay Jacob Martin's message "There is no reason for him not to do it. Also, he has been referred out to adult endocrine. He is not currently our patient and needs to establish care for endocrinology with an adult practice"   He has not established anywhere.  I checked the referrals and do not see one on file.  He is going to call his insurance to see if there is a specific practice/dr his insurance covers and if he needs a referral from [place].  He will call back with this information so the appropriate referral can be sent.

## 2023-02-11 ENCOUNTER — Other Ambulatory Visit (INDEPENDENT_AMBULATORY_CARE_PROVIDER_SITE_OTHER): Payer: Self-pay | Admitting: Family

## 2023-02-11 DIAGNOSIS — E1065 Type 1 diabetes mellitus with hyperglycemia: Secondary | ICD-10-CM

## 2023-02-11 NOTE — Telephone Encounter (Signed)
Called patient back to update that Jacob Martin will place the referral to an adult endo Jacob Martin) that has recently had the shorter wait time for appointments.  In the meantime if he needs refills or supplies he will need to make an appt with Jacob Martin or see his PCP for those refills.  He asked if there was something closer, I explained that most have a 6 mo - year wait list right now.  He stated that his new insurance will cover visits to [facility] [facility] but his last insurance did not.  I explained that he is now an adult and needs to be seen by an adult endo.  He verbalized understanding.

## 2023-02-12 ENCOUNTER — Encounter (INDEPENDENT_AMBULATORY_CARE_PROVIDER_SITE_OTHER): Payer: Self-pay

## 2023-02-12 NOTE — Telephone Encounter (Signed)
Referral faxed to [facility] through [facility]

## 2023-03-03 ENCOUNTER — Telehealth (INDEPENDENT_AMBULATORY_CARE_PROVIDER_SITE_OTHER): Payer: Self-pay | Admitting: Family

## 2023-03-03 NOTE — Telephone Encounter (Signed)
  Name of who is calling: Marzell  Caller's Relationship to Patient:  Best contact number: [phone]  Provider they see: Ovidio Kin  Reason for call: he is wanting to speak to someone in reference to his license and them being suspended due to medical cancellation. He spoke with his insurance company and they told him to reach out and speak to someone. Please follow up    PRESCRIPTION REFILL ONLY  Name of prescription:  Pharmacy:

## 2023-03-03 NOTE — Telephone Encounter (Signed)
Who's calling (name and relationship to patient) :[name] - self   Best contact number:[phone]   Provider they see: Dr Dalbert Garnet  Reason for call: Jacob Martin came into the office due to his license being suspended. The DMV stated he failed a eye test but [name] said he never took one at the eye doctor nor at this office. The DMV said it has been suspended since Jan 2023. Before Dr Dalbert Garnet Submitted a packet that has to be filled out every year.     Call ID:      PRESCRIPTION REFILL ONLY  Name of prescription:  Pharmacy:

## 2023-03-04 ENCOUNTER — Telehealth (INDEPENDENT_AMBULATORY_CARE_PROVIDER_SITE_OTHER): Payer: Self-pay | Admitting: Family

## 2023-03-04 NOTE — Telephone Encounter (Signed)
Spoke with [employer] and then returned call to patient to explain that I am unable to speak with mom as we do not have signed permission on file.  Also explained that it has been more than a year since he was last seen and he is over the age 22.  He will need to see the new endo, suggested that he ask to get on their wait list or speak with his PCP regarding this.  He verbalized understanding.

## 2023-03-04 NOTE — Telephone Encounter (Signed)
Mom is calling wanting referral sent to [facility] endo, mom was able to talk to someone there today and was told they have a new provider starting next month. She said she would rather have the referral sent there than to drive to [facility] for appt that is scheduled.

## 2023-03-04 NOTE — Telephone Encounter (Signed)
Called patient back to relay Spenser's message " This is the first I have heard about this. I have not seen him in over a year and have not received any packets about his license. I would have nothing to do with his vision either. He will need to reach out to the [facility] and whoever his eye doctor is.  " Left HIPAA approved voicemail to check mychart or call back.

## 2023-03-04 NOTE — Telephone Encounter (Signed)
Mom called back in about the Endo referral that was sent. Mom said that it was too long to wait to get in to see that Endo Dr and asked if [name] can be scheduled to see Karleen Hampshire to get the papers signs to get his license back.

## 2023-03-05 DIAGNOSIS — E1065 Type 1 diabetes mellitus with hyperglycemia: Secondary | ICD-10-CM | POA: Diagnosis not present

## 2023-03-09 DIAGNOSIS — E119 Type 2 diabetes mellitus without complications: Secondary | ICD-10-CM | POA: Diagnosis not present

## 2023-04-05 ENCOUNTER — Ambulatory Visit: Payer: 59 | Admitting: Nurse Practitioner

## 2023-04-15 ENCOUNTER — Other Ambulatory Visit (INDEPENDENT_AMBULATORY_CARE_PROVIDER_SITE_OTHER): Payer: 59

## 2023-04-15 ENCOUNTER — Encounter: Payer: Self-pay | Admitting: "Endocrinology

## 2023-04-15 ENCOUNTER — Other Ambulatory Visit: Payer: Self-pay

## 2023-04-15 DIAGNOSIS — E109 Type 1 diabetes mellitus without complications: Secondary | ICD-10-CM

## 2023-04-15 LAB — COMPREHENSIVE METABOLIC PANEL
ALT: 17 U/L (ref 0–53)
AST: 15 U/L (ref 0–37)
Albumin: 4.4 g/dL (ref 3.5–5.2)
Alkaline Phosphatase: 99 U/L (ref 39–117)
BUN: 9 mg/dL (ref 6–23)
CO2: 30 mEq/L (ref 19–32)
Calcium: 9.5 mg/dL (ref 8.4–10.5)
Chloride: 99 mEq/L (ref 96–112)
Creatinine, Ser: 1.04 mg/dL (ref 0.40–1.50)
GFR: 102.31 mL/min (ref >60.00)
Glucose, Bld: 424 mg/dL — ABNORMAL HIGH (ref 70–99)
Potassium: 4.1 mEq/L (ref 3.5–5.1)
Sodium: 136 mEq/L (ref 135–145)
Total Bilirubin: 0.5 mg/dL (ref 0.2–1.2)
Total Protein: 7 g/dL (ref 6.0–8.3)

## 2023-04-15 LAB — LIPID PANEL
Cholesterol: 164 mg/dL (ref 0–200)
HDL: 59.9 mg/dL (ref >39.00)
LDL Cholesterol: 93 mg/dL (ref 0–99)
NonHDL: 104.12
Total CHOL/HDL Ratio: 3
Triglycerides: 56 mg/dL (ref 0.0–149.0)
VLDL: 11.2 mg/dL (ref 0.0–40.0)

## 2023-04-15 LAB — MICROALBUMIN / CREATININE URINE RATIO
Creatinine,U: 37.3 mg/dL
Microalb Creat Ratio: 1.9 mg/g (ref 0.0–30.0)
Microalb, Ur: <0.7 mg/dL (ref 0.0–1.9)

## 2023-04-15 LAB — HEMOGLOBIN A1C: Hgb A1c MFr Bld: 11 % — ABNORMAL HIGH (ref 4.6–6.5)

## 2023-04-20 ENCOUNTER — Ambulatory Visit: Payer: 59 | Admitting: "Endocrinology

## 2023-04-20 ENCOUNTER — Encounter: Payer: Self-pay | Admitting: "Endocrinology

## 2023-04-20 VITALS — BP 110/70 | HR 93 | Ht 72.0 in | Wt 162.4 lb

## 2023-04-20 DIAGNOSIS — E7800 Pure hypercholesterolemia, unspecified: Secondary | ICD-10-CM

## 2023-04-20 DIAGNOSIS — E1065 Type 1 diabetes mellitus with hyperglycemia: Secondary | ICD-10-CM

## 2023-04-20 MED ORDER — SKIN TAC ADHESIVE BARRIER WIPE MISC: 1.0000 | Freq: Three times a day (TID) | 3 refills | Status: AC

## 2023-04-20 NOTE — Patient Instructions (Signed)

## 2023-04-20 NOTE — Progress Notes (Signed)
Outpatient Endocrinology Note Jacob [place], MD  04/20/23   Jacob Martin [birth year] [id number]  Referring Provider: Gretchen Short, NP Primary Care Provider: Maryagnes Amos, FNP Reason for consultation: Subjective   Assessment & Plan  Diagnoses and all orders for this visit:  Type 1 diabetes mellitus with hyperglycemia (HCC)  Pure hypercholesterolemia  Other orders -     Ostomy Supplies (SKIN TAC ADHESIVE BARRIER WIPE) MISC; 1 each by Does not apply route 3 (three) times daily before meals.    Diabetes complicated by hyperglycemia Hba1c goal less than 7.0, current Hba1c is 11. Will recommend for the following change of medications to: Novolog 13-15 after meal Lantus 16-20 qhs  No known contraindications to any of above medications Glucagon +  Hyperlipidemia -Last LDL near goal: 93 -not on statin  -Follow low fat diet and exercise   -Blood pressure goal <140/90 - Microalbumin/creatinine at goal < 30 -not on ACE/ARB  -diet changes including salt restriction -limit eating outside -counseled BP targets per standards of diabetes care -Uncontrolled blood pressure can lead to retinopathy, nephropathy and cardiovascular and atherosclerotic heart disease  Reviewed and counseled on: -A1C target -Blood sugar targets -Complications of uncontrolled diabetes  -Checking blood sugar before meals and bedtime and bring log next visit -All medications with mechanism of action and side effects -Hypoglycemia management: rule of 15's, Glucagon Emergency Kit and medical alert ID -low-carb low-fat plate-method diet -At least 20 minutes of physical activity per day -Annual dilated retinal eye exam and foot exam -compliance and follow up needs -follow up as scheduled or earlier if problem gets worse  Call if blood sugar is less than 70 or consistently above 250    Take a 15 gm snack of carbohydrate at bedtime before you go to sleep if your blood sugar is  less than 100.    If you are going to fast after midnight for a test or procedure, ask your physician for instructions on how to reduce/decrease your insulin dose.    Call if blood sugar is less than 70 or consistently above 250  -Treating a low sugar by rule of 15  (15 gms of sugar every 15 min until sugar is more than 70) If you feel your sugar is low, test your sugar to be sure If your sugar is low (less than 70), then take 15 grams of a fast acting Carbohydrate (3-4 glucose tablets or glucose gel or 4 ounces of juice or regular soda) Recheck your sugar 15 min after treating low to make sure it is more than 70 If sugar is still less than 70, treat again with 15 grams of carbohydrate          Don't drive the hour of hypoglycemia  If unconscious/unable to eat or drink by mouth, use glucagon injection or nasal spray baqsimi and call 911. Can repeat again in 15 min if still unconscious.  Return in about 6 weeks (around 06/01/2023).   I have reviewed current medications, nurse's notes, allergies, vital signs, past medical and surgical history, family medical history, and social history for this encounter. Counseled patient on symptoms, examination findings, lab findings, imaging results, treatment decisions and monitoring and prognosis. The patient understood the recommendations and agrees with the treatment plan. All questions regarding treatment plan were fully answered.  Jacob , MD  04/20/23    History of Present Illness Jacob Martin is a 22 y.o. year old male who presents for evaluation of Type 1 diabetes  mellitus.  Jacob Martin was first diagnosed in 2015.   Diabetes education +  Home diabetes regimen: Novolog 13-15 after meal Lantus 16-20 qhs  COMPLICATIONS -  MI/Stroke -  retinopathy -  neuropathy -  nephropathy  SYMPTOMS REVIEWED - Polyuria + Weight loss - Blurred vision  BLOOD SUGAR DATA No data  Has DexCom Per 77-356  Physical Exam  BP  110/70   Pulse 93   Ht 6' (1.829 m)   Wt 162 lb 6.4 oz (73.7 kg)   SpO2 97%   BMI 22.03 kg/m    Constitutional: well developed, well nourished Head: normocephalic, atraumatic Eyes: sclera anicteric, no redness Neck: supple Lungs: normal respiratory effort Neurology: alert and oriented Skin: dry, no appreciable rashes Musculoskeletal: no appreciable defects Psychiatric: normal mood and affect Diabetic Foot Exam - Simple   No data filed      Current Medications Patient's Medications  New Prescriptions   OSTOMY SUPPLIES (SKIN TAC ADHESIVE BARRIER WIPE) MISC    1 each by Does not apply route 3 (three) times daily before meals.  Previous Medications   BENZONATATE (TESSALON) 100 MG CAPSULE    Take 1 capsule (100 mg total) by mouth every 8 (eight) hours.   CLOTRIMAZOLE (MYCELEX) 10 MG TROCHE    DISSOLVE 1 LOZENGES FIVE TIMES DAILY AS DIRECTED BY MD   CONTINUOUS BLOOD GLUC RECEIVER (DEXCOM G6 RECEIVER) DEVI    1 Device by Does not apply route daily as needed.   CONTINUOUS BLOOD GLUC SENSOR (DEXCOM G6 SENSOR) MISC    Change sensor every 10 days as directed.   CONTINUOUS BLOOD GLUC TRANSMIT (DEXCOM G6 TRANSMITTER) MISC    USE DAILY AS NEEDED   GLUCAGON 1 MG INJECTION    Use for Severe Hypoglycemia . Inject 1 mg intramuscularly if unresponsive, unable to swallow, unconscious and/or has seizure   GLUCOSE BLOOD (ACCU-CHEK GUIDE) TEST STRIP    Check BG 6x day   IBUPROFEN (ADVIL,MOTRIN) 600 MG TABLET    Take 1 tab PO Q6H x 1-2 days then Q6h prn   INSULIN ASPART FLEXPEN (NOVOLOG) 100 UNIT/ML    ADMINISTER UP TO 100 UNITS UNDER THE SKIN DAILY   INSULIN GLARGINE (LANTUS) 100 UNIT/ML SOLOSTAR PEN    Inject up to 50 units per day as advised.   INSULIN PEN NEEDLE (BD PEN NEEDLE NANO 2ND GEN) 32G X 4 MM MISC    Use with insulin   ONDANSETRON (ZOFRAN-ODT) 4 MG DISINTEGRATING TABLET    4mg  ODT q4 hours prn nausea/vomit  Modified Medications   No medications on file  Discontinued Medications   No  medications on file    Allergies Allergies  Allergen Reactions   Shrimp [Shellfish Allergy] Nausea And Vomiting    Can eat crab legs without any issues    Past Medical History Past Medical History:  Diagnosis Date   Asthma    Diabetes mellitus without complication (HCC)     Past Surgical History Past Surgical History:  Procedure Laterality Date   FRACTURE SURGERY     right forearm hairline fx/ cast, but no pins/rods    Family History family history includes Diabetes in his paternal grandmother; Hypertension in his father, paternal grandfather, and paternal grandmother; Thyroid disease in his paternal grandmother.  Social History Social History   Socioeconomic History   Marital status: Single    Spouse name: Not on file   Number of children: Not on file   Years of education: Not on file   Highest  education level: Not on file  Occupational History   Not on file  Tobacco Use   Smoking status: Never   Smokeless tobacco: Never  Substance and Sexual Activity   Alcohol use: No   Drug use: No   Sexual activity: Not on file  Other Topics Concern   Not on file  Social History Narrative   Lives at home with dad, and goes to stay with mom every other weekend. He enjoys God, Arts administrator, [id number]    Sophomore at [employer]. Going for [employer].   Social Determinants of Health   Financial Resource Strain: Not on file  Food Insecurity: Not on file  Transportation Needs: Not on file  Physical Activity: Not on file  Stress: Not on file  Social Connections: Not on file  Intimate Partner Violence: Not on file    Lab Results  Component Value Date   HGBA1C 11.0 (H) 04/15/2023   HGBA1C 7.5 (A) 12/09/2021   HGBA1C 7.9 (A) 01/02/2021   Lab Results  Component Value Date   CHOL 164 04/15/2023   Lab Results  Component Value Date   HDL 59.90 04/15/2023   Lab Results  Component Value Date   LDLCALC 93 04/15/2023   Lab Results  Component Value Date   TRIG 56.0 04/15/2023    Lab Results  Component Value Date   CHOLHDL 3 04/15/2023   Lab Results  Component Value Date   CREATININE 1.04 04/15/2023   Lab Results  Component Value Date   GFR 102.31 04/15/2023   Lab Results  Component Value Date   MICROALBUR <0.7 04/15/2023      Component Value Date/Time   NA 136 04/15/2023 1407   K 4.1 04/15/2023 1407   CL 99 04/15/2023 1407   CO2 30 04/15/2023 1407   GLUCOSE 424 (H) 04/15/2023 1407   BUN 9 04/15/2023 1407   CREATININE 1.04 04/15/2023 1407   CREATININE 0.85 03/24/2016 1000   CALCIUM 9.5 04/15/2023 1407   PROT 7.0 04/15/2023 1407   ALBUMIN 4.4 04/15/2023 1407   AST 15 04/15/2023 1407   ALT 17 04/15/2023 1407   ALKPHOS 99 04/15/2023 1407   BILITOT 0.5 04/15/2023 1407   GFRNONAA >60 10/02/2022 1759   GFRNONAA >89 03/24/2016 1000   GFRAA >89 03/24/2016 1000      Latest Ref Rng & Units 04/15/2023    2:07 PM 10/02/2022    5:59 PM 03/24/2016   10:00 AM  BMP  Glucose 70 - 99 mg/dL 161  096  045   BUN 6 - 23 mg/dL 9  12  15    Creatinine 0.40 - 1.50 mg/dL 4.09  8.11  9.14   Sodium 135 - 145 mEq/L 136  137  138   Potassium 3.5 - 5.1 mEq/L 4.1  4.4  4.8   Chloride 96 - 112 mEq/L 99  102  101   CO2 19 - 32 mEq/L 30  28  28    Calcium 8.4 - 10.5 mg/dL 9.5  9.5  9.5        Component Value Date/Time   WBC 3.1 (L) 10/02/2022 1759   RBC 5.01 10/02/2022 1759   HGB 15.6 10/02/2022 1759   HCT 46.7 10/02/2022 1759   PLT 165 10/02/2022 1759   MCV 93.2 10/02/2022 1759   MCH 31.1 10/02/2022 1759   MCHC 33.4 10/02/2022 1759   RDW 11.5 10/02/2022 1759   LYMPHSABS 1,125 (L) 03/24/2016 1000   MONOABS 325 03/24/2016 1000   EOSABS 50 03/24/2016 1000  BASOSABS 0 03/24/2016 1000     Parts of this note may have been dictated using voice recognition software. There may be variances in spelling and vocabulary which are unintentional. Not all errors are proofread. Please notify the Thereasa Parkin if any discrepancies are noted or if the meaning of any statement is  not clear.

## 2023-04-28 LAB — GAD65, IA-2, AND INSULIN AUTOANTIBODY SERUM
Glutamic Acid Decarb Ab: 54 IU/mL — ABNORMAL HIGH (ref <5)
IA-2 Antibody: 16.7 U/mL — ABNORMAL HIGH (ref <5.4)
Insulin Antibodies, Human: <0.4 U/mL (ref <0.4)

## 2023-05-03 ENCOUNTER — Ambulatory Visit: Payer: Self-pay | Admitting: Nurse Practitioner

## 2023-05-03 ENCOUNTER — Other Ambulatory Visit: Payer: Self-pay

## 2023-05-03 DIAGNOSIS — R739 Hyperglycemia, unspecified: Secondary | ICD-10-CM

## 2023-05-03 MED ORDER — DEXCOM G6 SENSOR MISC: 5 refills | Status: AC

## 2023-06-02 ENCOUNTER — Ambulatory Visit: Payer: 59 | Admitting: "Endocrinology

## 2023-08-12 ENCOUNTER — Other Ambulatory Visit (INDEPENDENT_AMBULATORY_CARE_PROVIDER_SITE_OTHER): Payer: Self-pay | Admitting: Family

## 2023-09-03 ENCOUNTER — Telehealth: Payer: Self-pay | Admitting: "Endocrinology

## 2023-09-03 ENCOUNTER — Other Ambulatory Visit: Payer: Self-pay

## 2023-09-03 MED ORDER — DEXCOM G6 TRANSMITTER MISC: 1 refills | Status: AC

## 2023-09-03 MED ORDER — BD PEN NEEDLE NANO 2ND GEN 32G X 4 MM MISC: 5 refills | Status: AC

## 2023-09-03 NOTE — Telephone Encounter (Signed)
Patient states he need a refill on his DEXCOM G6 transmitter, BD-nano needles. Please call into [facility] on [address] and advise patient when done.

## 2023-09-03 NOTE — Telephone Encounter (Signed)
Dexcom G6 Transmitter and Pen Needles have been sent to [facility]

## 2023-09-05 ENCOUNTER — Emergency Department (HOSPITAL_BASED_OUTPATIENT_CLINIC_OR_DEPARTMENT_OTHER)
Admission: EM | Admit: 2023-09-05 | Discharge: 2023-09-05 | Disposition: A | Discharge status: Home / Self Care | Payer: 59 | Attending: Emergency Medicine | Admitting: Emergency Medicine

## 2023-09-05 ENCOUNTER — Encounter (HOSPITAL_BASED_OUTPATIENT_CLINIC_OR_DEPARTMENT_OTHER): Payer: Self-pay

## 2023-09-05 ENCOUNTER — Emergency Department (HOSPITAL_BASED_OUTPATIENT_CLINIC_OR_DEPARTMENT_OTHER): Payer: 59

## 2023-09-05 DIAGNOSIS — J45909 Unspecified asthma, uncomplicated: Secondary | ICD-10-CM | POA: Insufficient documentation

## 2023-09-05 DIAGNOSIS — Z794 Long term (current) use of insulin: Secondary | ICD-10-CM | POA: Diagnosis not present

## 2023-09-05 DIAGNOSIS — E109 Type 1 diabetes mellitus without complications: Secondary | ICD-10-CM | POA: Insufficient documentation

## 2023-09-05 DIAGNOSIS — N433 Hydrocele, unspecified: Secondary | ICD-10-CM | POA: Diagnosis not present

## 2023-09-05 DIAGNOSIS — N50812 Left testicular pain: Secondary | ICD-10-CM | POA: Diagnosis not present

## 2023-09-05 DIAGNOSIS — N453 Epididymo-orchitis: Secondary | ICD-10-CM

## 2023-09-05 DIAGNOSIS — N5089 Other specified disorders of the male genital organs: Secondary | ICD-10-CM | POA: Diagnosis not present

## 2023-09-05 LAB — URINALYSIS, ROUTINE W REFLEX MICROSCOPIC
Bacteria, UA: NONE SEEN
Bilirubin Urine: NEGATIVE
Glucose, UA: >1000 mg/dL — AB
Hgb urine dipstick: NEGATIVE
Ketones, ur: >80 mg/dL — AB
Leukocytes,Ua: NEGATIVE
Nitrite: NEGATIVE
Protein, ur: 30 mg/dL — AB
Specific Gravity, Urine: >1.046 — ABNORMAL HIGH (ref 1.005–1.030)
Trans Epithel, UA: 1
pH: 6 (ref 5.0–8.0)

## 2023-09-05 MED ORDER — CEFTRIAXONE SODIUM 500 MG IJ SOLR
500.0000 mg | Freq: Once | INTRAMUSCULAR | Status: AC
  Administered 2023-09-05: 500 mg via INTRAMUSCULAR
  Filled 2023-09-05: qty 500

## 2023-09-05 MED ORDER — KETOROLAC TROMETHAMINE 10 MG PO TABS: 10.0000 mg | ORAL_TABLET | Freq: Four times a day (QID) | ORAL | 0 refills | Status: AC | PRN

## 2023-09-05 MED ORDER — DOXYCYCLINE HYCLATE 100 MG PO TABS
100.0000 mg | ORAL_TABLET | Freq: Once | ORAL | Status: AC
  Administered 2023-09-05: 100 mg via ORAL
  Filled 2023-09-05: qty 1

## 2023-09-05 MED ORDER — DOXYCYCLINE HYCLATE 100 MG PO CAPS: 100.0000 mg | ORAL_CAPSULE | Freq: Two times a day (BID) | ORAL | 0 refills | Status: AC

## 2023-09-05 MED ORDER — KETOROLAC TROMETHAMINE 30 MG/ML IJ SOLN
30.0000 mg | Freq: Once | INTRAMUSCULAR | Status: AC
  Administered 2023-09-05: 30 mg via INTRAMUSCULAR
  Filled 2023-09-05: qty 1

## 2023-09-05 NOTE — ED Triage Notes (Signed)
He reports gradually worsening and persistent left testicular pain x ~ 6 days. He denies dysuria/fever and is in no distress.

## 2023-09-05 NOTE — Discharge Instructions (Addendum)
As discussed, visit today consistent with epididymoorchitis with inflammation of your epididymis as well as your left testicle.  Will treat this with antibiotics in the outpatient setting.  We have been treated with 1 dose of antibiotic shot while in the ED.  Recommend continuation of 10-day course of oral antibiotics at home.  Recommend taking Toradol as needed for pain; this is in the same family as ibuprofen/Aleve so please do not take these medications together.  Recommend calling urology in the outpatient setting for repeat assessment of your symptoms.  Please do not hesitate to return to emergency department for worrisome signs and symptoms we discussed become apparent.

## 2023-09-05 NOTE — ED Provider Notes (Signed)
[facility] AT [facility] Provider Note   CSN: [id number] Arrival date & time: 09/05/23  1223     History  Chief Complaint  Patient presents with   Testicle Pain    Jacob Martin is a 22 y.o. male.   Testicle Pain    22 year old male presents emergency department with complaints of left testicle pain.  Patient states he has been having left-sided testicle pain for the past 5 to 6 days.  States that initially occurred after he worked out.  Reports noticing swelling to his left testicle.  Denies any abdominal pain, urinary symptoms, penile discharge, rash.  Denies history of similar symptoms in the past.  Past medical history significant for asthma, diabetes mellitus type 1  Home Medications Prior to Admission medications   Medication Sig Start Date End Date Taking? Authorizing Provider  doxycycline (VIBRAMYCIN) 100 MG capsule Take 1 capsule (100 mg total) by mouth 2 (two) times daily. 09/06/23  Yes Sherian Maroon A, PA  ketorolac (TORADOL) 10 MG tablet Take 1 tablet (10 mg total) by mouth every 6 (six) hours as needed. 09/05/23  Yes Sherian Maroon A, PA  benzonatate (TESSALON) 100 MG capsule Take 1 capsule (100 mg total) by mouth every 8 (eight) hours. 11/16/22   Melene Plan, DO  clotrimazole (MYCELEX) 10 MG troche DISSOLVE 1 LOZENGES FIVE TIMES DAILY AS DIRECTED BY MD 12/25/18   [provider]  Continuous Blood Gluc Receiver (DEXCOM G6 RECEIVER) DEVI 1 Device by Does not apply route daily as needed. 08/17/19   David Stall, MD  Continuous Glucose Sensor (DEXCOM G6 SENSOR) MISC Change sensor every 10 days as directed. 05/03/23   Altamese Cearfoss, MD  Continuous Glucose Transmitter (DEXCOM G6 TRANSMITTER) MISC USE DAILY AS NEEDED 09/03/23   Motwani, Carin Hock, MD  glucagon 1 MG injection Use for Severe Hypoglycemia . Inject 1 mg intramuscularly if unresponsive, unable to swallow, unconscious and/or has seizure 01/01/20   Gretchen Short, NP   glucose blood (ACCU-CHEK GUIDE) test strip Check BG 6x day 01/25/18   Gretchen Short, NP  ibuprofen (ADVIL,MOTRIN) 600 MG tablet Take 1 tab PO Q6H x 1-2 days then Q6h prn 07/29/15   Lowanda Foster, NP  Insulin Aspart FlexPen (NOVOLOG) 100 UNIT/ML ADMINISTER UP TO 100 UNITS UNDER THE SKIN DAILY 05/01/22   Gretchen Short, NP  insulin glargine (LANTUS) 100 UNIT/ML Solostar Pen Inject up to 50 units per day as advised. Patient taking differently: Inject up to 50 units per day as advised.(16-18 units) 12/09/21   Gretchen Short, NP  Insulin Pen Needle (BD PEN NEEDLE NANO 2ND GEN) 32G X 4 MM MISC Use with insulin 09/03/23   Altamese Hurley, MD  ondansetron (ZOFRAN-ODT) 4 MG disintegrating tablet 4mg  ODT q4 hours prn nausea/vomit 11/16/22   Melene Plan, DO  Ostomy Supplies (SKIN TAC ADHESIVE BARRIER WIPE) MISC 1 each by Does not apply route 3 (three) times daily before meals. 04/20/23   Altamese Lorena, MD      Allergies    Shrimp [shellfish allergy]    Review of Systems   Review of Systems  Genitourinary:  Positive for testicular pain.  All other systems reviewed and are negative.   Physical Exam Updated Vital Signs BP 125/78 (BP Location: Right Arm)   Pulse 98   Temp 98.5 F (36.9 C) (Oral)   Resp 18   SpO2 98%  Physical Exam Vitals and nursing note reviewed.  Constitutional:      General: He is not in  acute distress.    Appearance: He is well-developed.  HENT:     Head: Normocephalic and atraumatic.  Eyes:     Conjunctiva/sclera: Conjunctivae normal.  Cardiovascular:     Rate and Rhythm: Normal rate and regular rhythm.     Heart sounds: No murmur heard. Pulmonary:     Effort: Pulmonary effort is normal. No respiratory distress.     Breath sounds: Normal breath sounds.  Abdominal:     Palpations: Abdomen is soft.     Tenderness: There is no abdominal tenderness.  Genitourinary:    Penis: Normal.      Comments: Patient with significantly larger palpable left-sided testicle when  compared to right.  Diffuse tenderness on exam.  Cremaster reflex intact bilaterally.  No obvious bag of worms type appearance.  No inguinal hernia appreciated with Valsalva. Musculoskeletal:        General: No swelling.     Cervical back: Neck supple.  Skin:    General: Skin is warm and dry.     Capillary Refill: Capillary refill takes less than 2 seconds.  Neurological:     Mental Status: He is alert.  Psychiatric:        Mood and Affect: Mood normal.     ED Results / Procedures / Treatments   Labs (all labs ordered are listed, but only abnormal results are displayed) Labs Reviewed  URINALYSIS, ROUTINE W REFLEX MICROSCOPIC - Abnormal; Notable for the following components:      Result Value   Specific Gravity, Urine >1.046 (*)    Glucose, UA >1,000 (*)    Ketones, ur >80 (*)    Protein, ur 30 (*)    All other components within normal limits  GC/CHLAMYDIA PROBE AMP ([place]) NOT AT [facility]    EKG None  Radiology US SCROTUM W/DOPPLER  Result Date: 09/05/2023 CLINICAL DATA:  Left testicular pain for the past 6 days. EXAM: SCROTAL ULTRASOUND DOPPLER ULTRASOUND OF THE TESTICLES TECHNIQUE: Complete ultrasound examination of the testicles, epididymis, and other scrotal structures was performed. Color and spectral Doppler ultrasound were also utilized to evaluate blood flow to the testicles. COMPARISON:  None Available. FINDINGS: Right testicle Measurements: 5.4 x 2.6 x 2.8 cm. No mass or microlithiasis visualized. Left testicle Measurements: 4.9 x 2.9 x 3.3 cm. Increased vascularity. No mass or microlithiasis visualized. Right epididymis:  Normal in size and appearance. Left epididymis:  Enlarged with increased vascularity in the body. Hydrocele:  Small left hydrocele. Varicocele:  None visualized. Pulsed Doppler interrogation of both testes demonstrates normal low resistance arterial and venous waveforms bilaterally. IMPRESSION: 1. Left epididymo-orchitis. Electronically Signed   By:  Obie Dredge M.D.   On: 09/05/2023 13:48    Procedures Procedures    Medications Ordered in ED Medications  cefTRIAXone (ROCEPHIN) injection 500 mg (has no administration in time range)  doxycycline (VIBRA-TABS) tablet 100 mg (has no administration in time range)  ketorolac (TORADOL) 30 MG/ML injection 30 mg (30 mg Intramuscular Given 09/05/23 1428)    ED Course/ Medical Decision Making/ A&P                                 Medical Decision Making Amount and/or Complexity of Data Reviewed Labs: ordered. Radiology: ordered.  Risk Prescription drug management.   This patient presents to the ED for concern of testicular pain, this involves an extensive number of treatment options, and is a complaint that carries with it  a high risk of complications and morbidity.  The differential diagnosis includes testicular torsion, malignancy, epididymitis, hydrocele, varicocele, other   Co morbidities that complicate the patient evaluation  See HPI   Additional history obtained:  Additional history obtained from EMR External records from outside source obtained and reviewed including hospital records   Lab Tests:  I Ordered, and personally interpreted labs.  The pertinent results include: UA without bacteria, WBCs leukocytes or nitrites.   Imaging Studies ordered:  I ordered imaging studies including testicular ultrasound I independently visualized and interpreted imaging which showed left epididymoorchitis I agree with the radiologist interpretation   Cardiac Monitoring: / EKG:  The patient was maintained on a cardiac monitor.  I personally viewed and interpreted the cardiac monitored which showed an underlying rhythm of: Rhythm   Consultations Obtained:  N/a   Problem List / ED Course / Critical interventions / Medication management  Left testicle pain, epididymoorchitis I ordered medication including Toradol, Rocephin, doxycycline Reevaluation of the patient  after these medicines showed that the patient improved I have reviewed the patients home medicines and have made adjustments as needed   Social Determinants of Health:  Denies tobacco, licit drug use   Test / Admission - Considered:  Left testicle pain, epididymoorchitis Vitals signs within normal range and stable throughout visit. Laboratory/imaging studies significant for: See above 22 year old male presents emergency department with complaints of left-sided testicular pain for the past 6 days or so.  On exam, patient with left-sided enlargement of testicle/epididymis.  Ultrasound imaging was obtained which did show evidence of left-sided epididymoorchitis without evidence of torsion or other abnormality acutely.  Per patient history, does have history of gonorrhea infection approximately a year ago with most recent sexual intercourse around a month ago.  Patient denies any participation in the anal intercourse.  Will treat patient empirically with antibiotics for coverage of STD with Rocephin as well as doxycycline and recommend follow-up with urology in the outpatient setting.  Further symptomatic therapy recommended as discussed in AVS.  Treatment plan discussed at length with patient and he acknowledged understanding was agreeable to said plan.  Patient overall well-appearing, afebrile in no acute distress. Worrisome signs and symptoms were discussed with the patient, and the patient acknowledged understanding to return to the ED if noticed. Patient was stable upon discharge.          Final Clinical Impression(s) / ED Diagnoses Final diagnoses:  Epididymoorchitis  Pain in left testicle    Rx / DC Orders ED Discharge Orders          Ordered    doxycycline (VIBRAMYCIN) 100 MG capsule  2 times daily        09/05/23 1506    ketorolac (TORADOL) 10 MG tablet  Every 6 hours PRN        09/05/23 1506              Peter Garter, [place] 09/05/23 1513    Laurence Spates,  MD 09/08/23 1553

## 2023-09-05 NOTE — ED Notes (Signed)
Reviewed AVS/discharge instruction with patient. Time allotted for and all questions answered. Patient is agreeable for d/c and escorted to ed exit by staff.  

## 2023-09-06 LAB — GC/CHLAMYDIA PROBE AMP (~~LOC~~) NOT AT ARMC
Chlamydia: NEGATIVE
Comment: NEGATIVE
Comment: NORMAL
Neisseria Gonorrhea: NEGATIVE

## 2023-09-09 ENCOUNTER — Encounter: Payer: 59 | Admitting: "Endocrinology

## 2023-09-09 ENCOUNTER — Encounter: Payer: Self-pay | Admitting: "Endocrinology

## 2023-09-09 NOTE — Progress Notes (Signed)
Now show

## 2024-02-29 ENCOUNTER — Encounter (INDEPENDENT_AMBULATORY_CARE_PROVIDER_SITE_OTHER): Payer: Self-pay

## 2024-03-13 ENCOUNTER — Encounter (INDEPENDENT_AMBULATORY_CARE_PROVIDER_SITE_OTHER): Payer: Self-pay
